# Patient Record
Sex: Female | Born: 1935 | Hispanic: Yes | Marital: Single
Health system: Southern US, Community
[De-identification: ages and names within clinical notes are randomized; demographics above are authoritative.]

## PROBLEM LIST (undated history)

## (undated) DIAGNOSIS — I10 Essential (primary) hypertension: Secondary | ICD-10-CM

---

## 2018-09-28 ENCOUNTER — Observation Stay
Admission: EM | Admit: 2018-09-28 | Discharge: 2018-09-29 | Disposition: A | Discharge status: Home / Self Care | Payer: Medicare Other | Attending: Internal Medicine | Admitting: Internal Medicine

## 2018-09-28 ENCOUNTER — Emergency Department: Payer: Medicare Other

## 2018-09-28 ENCOUNTER — Other Ambulatory Visit: Payer: Self-pay

## 2018-09-28 ENCOUNTER — Encounter: Payer: Self-pay | Admitting: Emergency Medicine

## 2018-09-28 DIAGNOSIS — R0902 Hypoxemia: Secondary | ICD-10-CM

## 2018-09-28 DIAGNOSIS — Z9114 Patient's other noncompliance with medication regimen: Secondary | ICD-10-CM | POA: Diagnosis not present

## 2018-09-28 DIAGNOSIS — I509 Heart failure, unspecified: Secondary | ICD-10-CM | POA: Diagnosis not present

## 2018-09-28 DIAGNOSIS — J9600 Acute respiratory failure, unspecified whether with hypoxia or hypercapnia: Secondary | ICD-10-CM | POA: Diagnosis present

## 2018-09-28 DIAGNOSIS — R0600 Dyspnea, unspecified: Secondary | ICD-10-CM

## 2018-09-28 DIAGNOSIS — Z66 Do not resuscitate: Secondary | ICD-10-CM | POA: Diagnosis not present

## 2018-09-28 DIAGNOSIS — I110 Hypertensive heart disease with heart failure: Secondary | ICD-10-CM | POA: Diagnosis not present

## 2018-09-28 DIAGNOSIS — I700 Atherosclerosis of aorta: Secondary | ICD-10-CM | POA: Diagnosis not present

## 2018-09-28 DIAGNOSIS — J9601 Acute respiratory failure with hypoxia: Principal | ICD-10-CM

## 2018-09-28 DIAGNOSIS — K769 Liver disease, unspecified: Secondary | ICD-10-CM | POA: Insufficient documentation

## 2018-09-28 DIAGNOSIS — Z79899 Other long term (current) drug therapy: Secondary | ICD-10-CM | POA: Diagnosis not present

## 2018-09-28 DIAGNOSIS — I10 Essential (primary) hypertension: Secondary | ICD-10-CM

## 2018-09-28 DIAGNOSIS — F0390 Unspecified dementia without behavioral disturbance: Secondary | ICD-10-CM | POA: Diagnosis not present

## 2018-09-28 HISTORY — DX: Essential (primary) hypertension: I10

## 2018-09-28 LAB — CBC WITH DIFFERENTIAL/PLATELET
ABS IMMATURE GRANULOCYTES: 0.07 10*3/uL (ref 0.00–0.07)
Basophils Absolute: 0.1 10*3/uL (ref 0.0–0.1)
Basophils Relative: 1 %
Eosinophils Absolute: 0.2 10*3/uL (ref 0.0–0.5)
Eosinophils Relative: 2 %
HCT: 38.2 % (ref 36.0–46.0)
HEMOGLOBIN: 12.3 g/dL (ref 12.0–15.0)
Immature Granulocytes: 1 %
LYMPHS PCT: 36 %
Lymphs Abs: 3.6 10*3/uL (ref 0.7–4.0)
MCH: 25.6 pg — AB (ref 26.0–34.0)
MCHC: 32.2 g/dL (ref 30.0–36.0)
MCV: 79.4 fL — ABNORMAL LOW (ref 80.0–100.0)
MONO ABS: 0.8 10*3/uL (ref 0.1–1.0)
Monocytes Relative: 8 %
NEUTROS ABS: 5.4 10*3/uL (ref 1.7–7.7)
Neutrophils Relative %: 52 %
Platelets: 392 10*3/uL (ref 150–400)
RBC: 4.81 MIL/uL (ref 3.87–5.11)
RDW: 13.9 % (ref 11.5–15.5)
WBC: 10.1 10*3/uL (ref 4.0–10.5)
nRBC: 0 % (ref 0.0–0.2)

## 2018-09-28 LAB — BRAIN NATRIURETIC PEPTIDE: B Natriuretic Peptide: 98 pg/mL (ref 0.0–100.0)

## 2018-09-28 LAB — COMPREHENSIVE METABOLIC PANEL
ALK PHOS: 80 U/L (ref 38–126)
ALT: 10 U/L (ref 0–44)
ANION GAP: 10 (ref 5–15)
AST: 16 U/L (ref 15–41)
Albumin: 3.3 g/dL — ABNORMAL LOW (ref 3.5–5.0)
BILIRUBIN TOTAL: 0.6 mg/dL (ref 0.3–1.2)
BUN: 10 mg/dL (ref 8–23)
CALCIUM: 8.9 mg/dL (ref 8.9–10.3)
CO2: 24 mmol/L (ref 22–32)
Chloride: 106 mmol/L (ref 98–111)
Creatinine, Ser: 0.66 mg/dL (ref 0.44–1.00)
GLUCOSE: 99 mg/dL (ref 70–99)
POTASSIUM: 4.2 mmol/L (ref 3.5–5.1)
Sodium: 140 mmol/L (ref 135–145)
Total Protein: 7.6 g/dL (ref 6.5–8.1)

## 2018-09-28 LAB — MRSA PCR SCREENING: MRSA BY PCR: NEGATIVE

## 2018-09-28 LAB — TROPONIN I: Troponin I: <0.03 ng/mL (ref <0.03)

## 2018-09-28 MED ORDER — HYDRALAZINE HCL 20 MG/ML IJ SOLN
10.0000 mg | Freq: Four times a day (QID) | INTRAMUSCULAR | Status: DC | PRN
  Administered 2018-09-28: 10 mg via INTRAVENOUS
  Filled 2018-09-28: qty 1

## 2018-09-28 MED ORDER — FUROSEMIDE 20 MG PO TABS
20.0000 mg | ORAL_TABLET | Freq: Every day | ORAL | Status: DC
  Administered 2018-09-29: 20 mg via ORAL
  Filled 2018-09-28: qty 1

## 2018-09-28 MED ORDER — POTASSIUM CHLORIDE CRYS ER 20 MEQ PO TBCR
20.0000 meq | EXTENDED_RELEASE_TABLET | Freq: Every day | ORAL | Status: DC
  Administered 2018-09-29: 20 meq via ORAL
  Filled 2018-09-28: qty 1

## 2018-09-28 MED ORDER — DOCUSATE SODIUM 100 MG PO CAPS: 100.0000 mg | ORAL_CAPSULE | Freq: Two times a day (BID) | ORAL | Status: DC | PRN

## 2018-09-28 MED ORDER — CLOPIDOGREL BISULFATE 75 MG PO TABS
75.0000 mg | ORAL_TABLET | Freq: Every day | ORAL | Status: DC
  Administered 2018-09-29: 75 mg via ORAL
  Filled 2018-09-28: qty 1

## 2018-09-28 MED ORDER — IOHEXOL 350 MG/ML SOLN
75.0000 mL | Freq: Once | INTRAVENOUS | Status: AC | PRN
  Administered 2018-09-28: 75 mL via INTRAVENOUS

## 2018-09-28 MED ORDER — GUAIFENESIN-CODEINE 100-10 MG/5ML PO SOLN
5.0000 mL | Freq: Four times a day (QID) | ORAL | Status: DC | PRN
  Administered 2018-09-28: 5 mL via ORAL
  Filled 2018-09-28 (×2): qty 5

## 2018-09-28 MED ORDER — GUAIFENESIN 100 MG/5ML PO SOLN
200.0000 mg | Freq: Four times a day (QID) | ORAL | Status: DC | PRN
  Administered 2018-09-29: 200 mg via ORAL
  Filled 2018-09-28 (×3): qty 10

## 2018-09-28 MED ORDER — MAGNESIUM HYDROXIDE 400 MG/5ML PO SUSP
30.0000 mL | Freq: Every evening | ORAL | Status: DC | PRN
  Filled 2018-09-28: qty 30

## 2018-09-28 MED ORDER — DONEPEZIL HCL 5 MG PO TABS
10.0000 mg | ORAL_TABLET | Freq: Every day | ORAL | Status: DC
  Administered 2018-09-29: 10 mg via ORAL
  Filled 2018-09-28: qty 2

## 2018-09-28 MED ORDER — ENOXAPARIN SODIUM 40 MG/0.4ML ~~LOC~~ SOLN
40.0000 mg | SUBCUTANEOUS | Status: DC
  Filled 2018-09-28: qty 0.4

## 2018-09-28 MED ORDER — ATORVASTATIN CALCIUM 10 MG PO TABS
10.0000 mg | ORAL_TABLET | Freq: Every day | ORAL | Status: DC
  Administered 2018-09-28 – 2018-09-29 (×2): 10 mg via ORAL
  Filled 2018-09-28 (×2): qty 1

## 2018-09-28 MED ORDER — VITAMIN D 1000 UNITS PO TABS
1000.0000 [IU] | ORAL_TABLET | Freq: Every day | ORAL | Status: DC
  Administered 2018-09-29: 1000 [IU] via ORAL
  Filled 2018-09-28: qty 1

## 2018-09-28 MED ORDER — HYDRALAZINE HCL 20 MG/ML IJ SOLN
INTRAMUSCULAR | Status: AC
  Filled 2018-09-28: qty 1

## 2018-09-28 MED ORDER — IPRATROPIUM-ALBUTEROL 0.5-2.5 (3) MG/3ML IN SOLN
3.0000 mL | Freq: Once | RESPIRATORY_TRACT | Status: AC
  Administered 2018-09-28: 3 mL via RESPIRATORY_TRACT
  Filled 2018-09-28: qty 3

## 2018-09-28 MED ORDER — DEXTROSE 5 % IV SOLN
250.0000 mg | INTRAVENOUS | Status: DC
  Administered 2018-09-28: 250 mg via INTRAVENOUS
  Filled 2018-09-28 (×2): qty 250

## 2018-09-28 MED ORDER — AMLODIPINE BESYLATE 10 MG PO TABS
10.0000 mg | ORAL_TABLET | Freq: Every day | ORAL | Status: DC
  Administered 2018-09-29: 10 mg via ORAL
  Filled 2018-09-28: qty 1

## 2018-09-28 MED ORDER — DOCUSATE SODIUM 100 MG PO CAPS
100.0000 mg | ORAL_CAPSULE | Freq: Two times a day (BID) | ORAL | Status: DC
  Administered 2018-09-28 – 2018-09-29 (×2): 100 mg via ORAL
  Filled 2018-09-28 (×2): qty 1

## 2018-09-28 MED ORDER — IPRATROPIUM-ALBUTEROL 0.5-2.5 (3) MG/3ML IN SOLN
RESPIRATORY_TRACT | Status: AC
  Administered 2018-09-28: 3 mL
  Filled 2018-09-28: qty 3

## 2018-09-28 MED ORDER — IPRATROPIUM-ALBUTEROL 0.5-2.5 (3) MG/3ML IN SOLN
3.0000 mL | RESPIRATORY_TRACT | Status: DC
  Administered 2018-09-28 – 2018-09-29 (×3): 3 mL via RESPIRATORY_TRACT
  Filled 2018-09-28 (×4): qty 3

## 2018-09-28 MED ORDER — LISINOPRIL 10 MG PO TABS
10.0000 mg | ORAL_TABLET | Freq: Every day | ORAL | Status: DC
  Administered 2018-09-29: 10 mg via ORAL
  Filled 2018-09-28: qty 1

## 2018-09-28 MED ORDER — METHYLPREDNISOLONE SODIUM SUCC 125 MG IJ SOLR
125.0000 mg | Freq: Once | INTRAMUSCULAR | Status: AC
  Administered 2018-09-28: 125 mg via INTRAVENOUS
  Filled 2018-09-28: qty 2

## 2018-09-28 MED ORDER — ACETAMINOPHEN 500 MG PO TABS: 500.0000 mg | ORAL_TABLET | ORAL | Status: DC | PRN

## 2018-09-28 NOTE — ED Notes (Addendum)
Pt stated that she did not want to stay and that she wanted to go home. She also refused to let this nurse take vital signs. Pt asked me to call her sister. In looking at her paperwork from [facility], [employer] listed as primary contact. Pt stated this is her sister. This nurse called Addison Naegeli and was told that this is her daughter, who has POA. She spoke to pt, who agreed to stay. Daughter would like to be contacted when a room is assigned to pt. Daughter unable to come from [place], and states pt sometimes has sundowners.   Daughter Katina Degree [phone]

## 2018-09-28 NOTE — ED Notes (Signed)
Pt given hydralazine; she was afraid that she was getting a "shot." She cried when the medication went in. This nurse explained about the IV catheter and let her touch one. She seemed to understand and will hopefull be cooperative for future medications.

## 2018-09-28 NOTE — ED Notes (Signed)
Pt given sandwich tray and water 

## 2018-09-28 NOTE — ED Provider Notes (Signed)
[facility] Emergency Department Provider Note   ____________________________________________   First MD Initiated Contact with Patient 09/28/18 1109     (approximate)  I have reviewed the triage vital signs and the nursing notes.   HISTORY  Chief Complaint Wheezing and Cough    HPI Jessica House is a 82 y.o. female patient with wheezing and cough for about 4 days.  Increasing short of breath.  She has history of dementia and also reportedly has been refusing care at the nursing home today.  Past Medical History:  Diagnosis Date  . Hypertension     There are no active problems to display for this patient.   History reviewed. No pertinent surgical history.  Prior to Admission medications   Not on File    Allergies Patient has no known allergies.  History reviewed. No pertinent family history.  Social History Social History   Tobacco Use  . Smoking status: Unknown If Ever Smoked  Substance Use Topics  . Alcohol use: Not Currently  . Drug use: Not Currently    Review of Systems  Patient too short of breath to do this.  ____________________________________________   PHYSICAL EXAM:  VITAL SIGNS: ED Triage Vitals [09/28/18 1123]  Enc Vitals Group     BP (!) 163/85     Pulse Rate 92     Resp 16     Temp 98.5 F (36.9 C)     Temp Source Axillary     SpO2 100 %     Weight 125 lb (56.7 kg)     Height 5\' 3"  (1.6 m)     Head Circumference      Peak Flow      Pain Score      Pain Loc      Pain Edu?      Excl. in GC?     Constitutional: Alert .  Short of breath follows most commands but not all Eyes: Conjunctivae are normal. I. Head: Atraumatic. Nose: No congestion/rhinnorhea. Mouth/Throat: Mucous membranes are moist.  Oropharynx non-erythematous. Neck: No stridor.  Cardiovascular: Normal rate, regular rhythm. Grossly normal heart sounds.  Good peripheral circulation. Respiratory: Increased respiratory effort.  No  retractions. Lungs wheezing diffusely Gastrointestinal: Soft and nontender. No distention. No abdominal bruits. No CVA tenderness. Musculoskeletal: No lower extremity tenderness slight trace edema she has a lot of wrinkles in the legs consistent with having lost a lot of swelling recently.   Neurologic: . No gross focal neurologic deficits are appreciated.  Skin:  Skin is warm, dry and intact. No rash noted.   ____________________________________________   LABS (all labs ordered are listed, but only abnormal results are displayed)  Labs Reviewed  COMPREHENSIVE METABOLIC PANEL - Abnormal; Notable for the following components:      Result Value   Albumin 3.3 (*)    All other components within normal limits  CBC WITH DIFFERENTIAL/PLATELET - Abnormal; Notable for the following components:   MCV 79.4 (*)    MCH 25.6 (*)    All other components within normal limits  TROPONIN I  BRAIN NATRIURETIC PEPTIDE  URINALYSIS, COMPLETE (UACMP) WITH MICROSCOPIC   ____________________________________________  EKG   ____________________________________________  RADIOLOGY  ED MD interpretation:    Official radiology report(s): Ct Angio Chest Pe W And/or Wo Contrast  Result Date: 09/28/2018 CLINICAL DATA:  cough and wheezing x 4 days. Pt was started on cough medicine at facility. Pt taking albuterol and duoneb upon arrival at ED. Pt has hx dementia,  htn. EMS also indicates area of cellulitis on LLE. Pt alert, follows commands. EXAM: CT ANGIOGRAPHY CHEST WITH CONTRAST TECHNIQUE: Multidetector CT imaging of the chest was performed using the standard protocol during bolus administration of intravenous contrast. Multiplanar CT image reconstructions and MIPs were obtained to evaluate the vascular anatomy. CONTRAST:  75mL OMNIPAQUE IOHEXOL 350 MG/ML SOLN COMPARISON:  None. FINDINGS: Cardiovascular: Mild four-chamber cardiac enlargement. No pericardial effusion. Satisfactory opacification of pulmonary  arteries noted, and there is no evidence of pulmonary emboli. Adequate contrast opacification of the thoracic aorta with no evidence of dissection, aneurysm, or stenosis. There is classic 3-vessel brachiocephalic arch anatomy without proximal stenosis. Scattered calcified plaque in the arch. Mediastinum/Nodes: No hilar or mediastinal adenopathy. Lungs/Pleura: No pleural effusion. No pneumothorax. Subsegmental atelectasis laterally at the right lung base. Upper Abdomen: 2.4 cm low-attenuation lateral left hepatic segment lesion, incompletely characterized. Musculoskeletal: Thoracic kyphosis. Mild T7 superior endplate compression deformity, age indeterminate. Anterior vertebral endplate spurring at multiple levels in the mid and lower thoracic spine. Review of the MIP images confirms the above findings. IMPRESSION: 1. Negative for acute PE or thoracic aortic dissection. 2. 2.4 cm lateral left liver lesion, possibly benign cyst but incompletely characterized. Ultrasound may be useful for definitive characterization. Electronically Signed   By: Corlis Leak M.D.   On: 09/28/2018 16:25   Dg Chest Portable 1 View  Result Date: 09/28/2018 CLINICAL DATA:  Cough and wheezing for the past 4 days. Also shortness of breath. EXAM: PORTABLE CHEST 1 VIEW COMPARISON:  None. FINDINGS: The lungs are borderline hypoinflated. The interstitial markings are coarse. The cardiac silhouette is enlarged. The pulmonary vascularity is prominent. There is no pleural effusion. The bony thorax exhibits diffuse subjective osteopenia but no acute abnormality. There is calcification in the wall of the aortic arch. IMPRESSION: The findings suggest low-grade CHF with mild interstitial edema. One cannot exclude interstitial pneumonia in the appropriate clinical setting. Thoracic aortic atherosclerosis. Electronically Signed   By: David  [place] M.D.   On: 09/28/2018 12:10     ____________________________________________   PROCEDURES  Procedure(s) performed:  Procedures  Critical Care performed:   ____________________________________________   INITIAL IMPRESSION / ASSESSMENT AND PLAN / ED COURSE   Patient's O2 saturation falls to 87% with ambulation.  She is still wheezing and short of breath.  Her CT of the chest shows no PE.  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  She has had 3 DuoNeb Solu-Medrol and one albuterol neb with only slight improvement.  We will get her in the hospital to finish her treatment.        ____________________________________________   FINAL CLINICAL IMPRESSION(S) / ED DIAGNOSES  Final diagnoses:  Dyspnea, unspecified type  Hypoxia     ED Discharge Orders    None       Note:  This document was prepared using Dragon voice recognition software and may include unintentional dictation errors.    Arnaldo Natal, MD 09/28/18 [phone]

## 2018-09-28 NOTE — ED Notes (Signed)
Pt's O2 sat dropped to 87% when walking with walker; recovered to 91%.

## 2018-09-28 NOTE — H&P (Signed)
[facility] at [facility]   PATIENT NAME: Jessica House    MR#:  [id number]  DATE OF BIRTH:  1936-05-10  DATE OF ADMISSION:  09/28/2018  PRIMARY CARE PHYSICIAN: [facility]   REQUESTING/REFERRING PHYSICIAN: Malinda  CHIEF COMPLAINT:   Chief Complaint  Patient presents with  . Wheezing  . Cough    HISTORY OF PRESENT ILLNESS: Jessica House  is a 82 y.o. female with a known history of hypertension-resident of a nursing home, brought to emergency room with cough and wheezing for 4 days.  She was noted to be hypoxic on ambulation by ER physician.  As per documentation by EMS, patient also was refusing further medications due to her dementia.  Her blood pressure noted to be high and she was noted to have hypoxia on ambulation in ER.  CT scan of the chest did not report any abnormalities.  But she had wheezing and required nebulizer therapy so ER physician suggested to keep on the hospitalist service for tonight.  PAST MEDICAL HISTORY:   Past Medical History:  Diagnosis Date  . Hypertension     PAST SURGICAL HISTORY: History reviewed. No pertinent surgical history.  SOCIAL HISTORY:  Social History   Tobacco Use  . Smoking status: Unknown If Ever Smoked  Substance Use Topics  . Alcohol use: Not Currently    FAMILY HISTORY:  Family History  Family history unknown: Yes    DRUG ALLERGIES:  Allergies  Allergen Reactions  . Orange Fruit [Citrus]     Orange juice    . Pineapple   . Pork Allergy     REVIEW OF SYSTEMS:   CONSTITUTIONAL: No fever, fatigue or weakness.  EYES: No blurred or double vision.  EARS, NOSE, AND THROAT: No tinnitus or ear pain.  RESPIRATORY: Have cough, shortness of breath, wheezing, no hemoptysis.  CARDIOVASCULAR: No chest pain, orthopnea, edema.  GASTROINTESTINAL: No nausea, vomiting, diarrhea or abdominal pain.  GENITOURINARY: No dysuria, hematuria.  ENDOCRINE: No polyuria, nocturia,  HEMATOLOGY: No  anemia, easy bruising or bleeding SKIN: No rash or lesion. MUSCULOSKELETAL: No joint pain or arthritis.   NEUROLOGIC: No tingling, numbness, weakness.  PSYCHIATRY: No anxiety or depression.   MEDICATIONS AT HOME:  Prior to Admission medications   Medication Sig Start Date End Date Taking? Authorizing Provider  acetaminophen (TYLENOL) 500 MG tablet Take 500 mg by mouth every 4 (four) hours as needed.   Yes [provider]  alum & mag hydroxide-simeth (MINTOX REGULAR STRENGTH) 200-200-20 MG/5ML suspension Take 30 mLs by mouth every 6 (six) hours as needed for indigestion or heartburn.   Yes [provider]  amLODipine (NORVASC) 10 MG tablet Take 10 mg by mouth daily.   Yes [provider]  atorvastatin (LIPITOR) 10 MG tablet Take 10 mg by mouth daily.   Yes [provider]  cholecalciferol (VITAMIN D) 1000 units tablet Take 1,000 Units by mouth daily.   Yes [provider]  clopidogrel (PLAVIX) 75 MG tablet Take 75 mg by mouth daily.   Yes [provider]  docusate sodium (COLACE) 100 MG capsule Take 100 mg by mouth 2 (two) times daily.   Yes [provider]  donepezil (ARICEPT) 10 MG tablet Take 10 mg by mouth daily.   Yes [provider]  ENSURE PLUS (ENSURE PLUS) LIQD Take 237 mLs by mouth 2 (two) times daily between meals.   Yes [provider]  furosemide (LASIX) 20 MG tablet Take 20 mg by mouth  daily.   Yes [provider]  guaifenesin (ROBITUSSIN) 100 MG/5ML syrup Take 200 mg by mouth 4 (four) times daily as needed for cough.   Yes [provider]  lisinopril (PRINIVIL,ZESTRIL) 10 MG tablet Take 10 mg by mouth daily.   Yes [provider]  magnesium hydroxide (MILK OF MAGNESIA) 400 MG/5ML suspension Take 30 mLs by mouth at bedtime as needed for mild constipation.   Yes [provider]  neomycin-bacitracin-polymyxin (NEOSPORIN) [phone] ointment Apply 1 application  topically 4 (four) times daily as needed. For minor skin tears or abrasions, clean area with normal saline, apply ointment and cover with bandaid or gauze. Change as needed until healed.   Yes [provider]  potassium chloride SA (K-DUR,KLOR-CON) 20 MEQ tablet Take 20 mEq by mouth daily.   Yes [provider]      PHYSICAL EXAMINATION:   VITAL SIGNS: Blood pressure (!) 193/97, pulse 94, temperature 98.5 F (36.9 C), temperature source Axillary, resp. rate 18, height 5\' 3"  (1.6 m), weight 56.7 kg, SpO2 100 %.  GENERAL:  82 y.o.-year-old patient lying in the bed with no acute distress.  EYES: Pupils equal, round, reactive to light and accommodation. No scleral icterus. Extraocular muscles intact.  HEENT: Head atraumatic, normocephalic. Oropharynx and nasopharynx clear.  NECK:  Supple, no jugular venous distention. No thyroid enlargement, no tenderness.  LUNGS: Normal breath sounds bilaterally, minimal wheezing, no crepitation. No use of accessory muscles of respiration.  CARDIOVASCULAR: S1, S2 normal. No murmurs, rubs, or gallops.  ABDOMEN: Soft, nontender, nondistended. Bowel sounds present. No organomegaly or mass.  EXTREMITIES: No pedal edema, cyanosis, or clubbing.  NEUROLOGIC: Cranial nerves II through XII are intact. Muscle strength 4/5 in all extremities. Sensation intact. Gait not checked.  PSYCHIATRIC: The patient is alert and oriented x 1.  SKIN: No obvious rash, lesion, or ulcer.   LABORATORY PANEL:   CBC Recent Labs  Lab 09/28/18 1124  WBC 10.1  HGB 12.3  HCT 38.2  PLT 392  MCV 79.4*  MCH 25.6*  MCHC 32.2  RDW 13.9  LYMPHSABS 3.6  MONOABS 0.8  EOSABS 0.2  BASOSABS 0.1   ------------------------------------------------------------------------------------------------------------------  Chemistries  Recent Labs  Lab 09/28/18 1124  NA 140  K 4.2  CL 106  CO2 24  GLUCOSE 99  BUN 10  CREATININE 0.66  CALCIUM 8.9  AST 16  ALT 10  ALKPHOS  80  BILITOT 0.6   ------------------------------------------------------------------------------------------------------------------ estimated creatinine clearance is 46.4 mL/min (by C-G formula based on SCr of 0.66 mg/dL). ------------------------------------------------------------------------------------------------------------------ No results for input(s): TSH, T4TOTAL, T3FREE, THYROIDAB in the last 72 hours.  Invalid input(s): FREET3   Coagulation profile No results for input(s): INR, PROTIME in the last 168 hours. ------------------------------------------------------------------------------------------------------------------- No results for input(s): DDIMER in the last 72 hours. -------------------------------------------------------------------------------------------------------------------  Cardiac Enzymes Recent Labs  Lab 09/28/18 1124  TROPONINI <0.03   ------------------------------------------------------------------------------------------------------------------ Invalid input(s): POCBNP  ---------------------------------------------------------------------------------------------------------------  Urinalysis No results found for: COLORURINE, APPEARANCEUR, LABSPEC, PHURINE, GLUCOSEU, HGBUR, BILIRUBINUR, KETONESUR, PROTEINUR, UROBILINOGEN, NITRITE, LEUKOCYTESUR   RADIOLOGY: Ct Angio Chest Pe W And/or Wo Contrast  Result Date: 09/28/2018 CLINICAL DATA:  cough and wheezing x 4 days. Pt was started on cough medicine at facility. Pt taking albuterol and duoneb upon arrival at ED. Pt has hx dementia, htn. EMS also indicates area of cellulitis on LLE. Pt alert, follows commands. EXAM: CT ANGIOGRAPHY CHEST WITH CONTRAST TECHNIQUE: Multidetector CT imaging of the chest was performed using the standard protocol during bolus administration of intravenous  contrast. Multiplanar CT image reconstructions and MIPs were obtained to evaluate the vascular anatomy. CONTRAST:  75mL  OMNIPAQUE IOHEXOL 350 MG/ML SOLN COMPARISON:  None. FINDINGS: Cardiovascular: Mild four-chamber cardiac enlargement. No pericardial effusion. Satisfactory opacification of pulmonary arteries noted, and there is no evidence of pulmonary emboli. Adequate contrast opacification of the thoracic aorta with no evidence of dissection, aneurysm, or stenosis. There is classic 3-vessel brachiocephalic arch anatomy without proximal stenosis. Scattered calcified plaque in the arch. Mediastinum/Nodes: No hilar or mediastinal adenopathy. Lungs/Pleura: No pleural effusion. No pneumothorax. Subsegmental atelectasis laterally at the right lung base. Upper Abdomen: 2.4 cm low-attenuation lateral left hepatic segment lesion, incompletely characterized. Musculoskeletal: Thoracic kyphosis. Mild T7 superior endplate compression deformity, age indeterminate. Anterior vertebral endplate spurring at multiple levels in the mid and lower thoracic spine. Review of the MIP images confirms the above findings. IMPRESSION: 1. Negative for acute PE or thoracic aortic dissection. 2. 2.4 cm lateral left liver lesion, possibly benign cyst but incompletely characterized. Ultrasound may be useful for definitive characterization. Electronically Signed   By: Corlis Leak M.D.   On: 09/28/2018 16:25   Dg Chest Portable 1 View  Result Date: 09/28/2018 CLINICAL DATA:  Cough and wheezing for the past 4 days. Also shortness of breath. EXAM: PORTABLE CHEST 1 VIEW COMPARISON:  None. FINDINGS: The lungs are borderline hypoinflated. The interstitial markings are coarse. The cardiac silhouette is enlarged. The pulmonary vascularity is prominent. There is no pleural effusion. The bony thorax exhibits diffuse subjective osteopenia but no acute abnormality. There is calcification in the wall of the aortic arch. IMPRESSION: The findings suggest low-grade CHF with mild interstitial edema. One cannot exclude interstitial pneumonia in the appropriate clinical setting.  Thoracic aortic atherosclerosis. Electronically Signed   By: David  [place]il[place] M.D.   On: 09/28/2018 12:10    EKG: Orders placed or performed during the hospital encounter of 09/28/18  . ED EKG  . ED EKG    IMPRESSION AND PLAN:  *Acute respiratory failure with hypoxia This was noted by ER physician on ambulation. Currently during my exam patient was more than 90% on room air. Likely there is a component of bronchitis. I will give azithromycin and cough syrup with some nebulizer therapy.  *Accelerated hypertension Blood pressure is uncontrolled.  As per ER physician's note patient was noncompliant with the medication at nursing home today. I will continue home medications and give hydralazine IV.  *Dementia Continue baseline home medication.  All the records are reviewed and case discussed with ED provider. Management plans discussed with the patient, family and they are in agreement.  CODE STATUS: DNR  Her CODE STATUS noted to be DNR and nursing home documents but there is no portable DNR papers sent with her.  We may need to confirm on this.  TOTAL TIME TAKING CARE OF THIS PATIENT: 45 minutes.    Altamese Dilling M.D on 09/28/2018   Between 7am to 6pm - Pager - [phone]  After 6pm go to [email or web address] - Social research officer, government  [facility]  Office  [phone]  CC: Primary care physician; [facility]   Note: This dictation was prepared with Dragon dictation along with smaller phrase technology. Any transcriptional errors that result from this process are unintentional.

## 2018-09-28 NOTE — ED Notes (Signed)
Pt soaked through undergarment and chux. Pt changed, cleaned, given new warm blanket and diaper, bedding.

## 2018-09-28 NOTE — Progress Notes (Signed)
Spoke to pt daughter and POA Addison Naegeli. She would like for medical staff to contact her during the day with updates and estimated dated of discharge.

## 2018-09-28 NOTE — ED Notes (Signed)
Blood cultures sent with blood tubes

## 2018-09-28 NOTE — ED Triage Notes (Signed)
Pt via ems from [facility] with cough and wheezing x 4 days. Pt was started on cough medicine at facility. Pt taking albuterol and duoneb upon arrival at ED. Pt has hx dementia, htn. EMS also indicates area of cellulitis on LLE. Pt alert, follows commands.

## 2018-09-29 DIAGNOSIS — J9601 Acute respiratory failure with hypoxia: Secondary | ICD-10-CM | POA: Diagnosis not present

## 2018-09-29 LAB — CBC
HEMATOCRIT: 33.7 % — AB (ref 36.0–46.0)
HEMOGLOBIN: 10.7 g/dL — AB (ref 12.0–15.0)
MCH: 25.4 pg — ABNORMAL LOW (ref 26.0–34.0)
MCHC: 31.8 g/dL (ref 30.0–36.0)
MCV: 79.9 fL — ABNORMAL LOW (ref 80.0–100.0)
NRBC: 0 % (ref 0.0–0.2)
Platelets: 329 10*3/uL (ref 150–400)
RBC: 4.22 MIL/uL (ref 3.87–5.11)
RDW: 13.9 % (ref 11.5–15.5)
WBC: 17.3 10*3/uL — AB (ref 4.0–10.5)

## 2018-09-29 LAB — BASIC METABOLIC PANEL
Anion gap: 8 (ref 5–15)
BUN: 16 mg/dL (ref 8–23)
CO2: 23 mmol/L (ref 22–32)
CREATININE: 0.72 mg/dL (ref 0.44–1.00)
Calcium: 8.7 mg/dL — ABNORMAL LOW (ref 8.9–10.3)
Chloride: 108 mmol/L (ref 98–111)
GFR calc non Af Amer: >60 mL/min (ref >60)
Glucose, Bld: 116 mg/dL — ABNORMAL HIGH (ref 70–99)
POTASSIUM: 3.8 mmol/L (ref 3.5–5.1)
SODIUM: 139 mmol/L (ref 135–145)

## 2018-09-29 MED ORDER — GUAIFENESIN-CODEINE 100-10 MG/5ML PO SOLN: 5.0000 mL | Freq: Four times a day (QID) | ORAL | 0 refills | Status: AC | PRN

## 2018-09-29 MED ORDER — AZITHROMYCIN 250 MG PO TABS
250.0000 mg | ORAL_TABLET | Freq: Every day | ORAL | Status: DC
  Administered 2018-09-29: 250 mg via ORAL
  Filled 2018-09-29: qty 1

## 2018-09-29 MED ORDER — IPRATROPIUM-ALBUTEROL 0.5-2.5 (3) MG/3ML IN SOLN
3.0000 mL | Freq: Four times a day (QID) | RESPIRATORY_TRACT | Status: DC
  Filled 2018-09-29: qty 3

## 2018-09-29 MED ORDER — AZITHROMYCIN 250 MG PO TABS: ORAL_TABLET | ORAL | 0 refills | Status: AC

## 2018-09-29 NOTE — NC FL2 (Addendum)
[place] MEDICAID FL2 LEVEL OF CARE SCREENING TOOL     IDENTIFICATION  Patient Name: Jessica House Birthdate: September 12, 1936 Sex: female Admission Date (Current Location): 09/28/2018  [facility] and [place] Number:  Chiropodist and Address:  [facility], [address] [place], [place] [address]      Provider Number: [id number]  Attending Physician Name and Address:  Enedina Finner, MD  Relative Name and Phone Number:       Current Level of Care: Hospital Recommended Level of Care: Assisted Living Facility, Memory Care Prior Approval Number:    Date Approved/Denied:   PASRR Number:    Discharge Plan: (ALF memory)    Current Diagnoses: Patient Active Problem List   Diagnosis Date Noted  . Acute respiratory failure with hypoxia (HCC) 09/28/2018  . Accelerated hypertension 09/28/2018  . Acute respiratory failure (HCC) 09/28/2018    Orientation RESPIRATION BLADDER Height & Weight     Self, Place  Normal Incontinent Weight: 167 lb 12.3 oz (76.1 kg) Height:  5\' 3"  (160 cm)  BEHAVIORAL SYMPTOMS/MOOD NEUROLOGICAL BOWEL NUTRITION STATUS  (none) (no issues reported) Incontinent Diet(heart healthy)  AMBULATORY STATUS COMMUNICATION OF NEEDS Skin   Supervision Verbally Normal                       Personal Care Assistance Level of Assistance  Bathing, Feeding, Dressing Bathing Assistance: Limited assistance Feeding assistance: Limited assistance Dressing Assistance: Limited assistance     Functional Limitations Info  (no issues reported)          SPECIAL CARE FACTORS FREQUENCY                       Contractures Contractures Info: Not present    Additional Factors Info  Code Status Code Status Info: dnr             DISCHARGE MEDICATIONS:        Allergies as of 09/29/2018      Reactions   Orange Fruit [citrus]    Orange juice    Pineapple    Pork Allergy          Medication List    TAKE these  medications   acetaminophen 500 MG tablet Commonly known as:  TYLENOL Take 500 mg by mouth every 4 (four) hours as needed.   amLODipine 10 MG tablet Commonly known as:  NORVASC Take 10 mg by mouth daily.   atorvastatin 10 MG tablet Commonly known as:  LIPITOR Take 10 mg by mouth daily.   azithromycin 250 MG tablet Commonly known as:  ZITHROMAX Take daily as dirrected   cholecalciferol 1000 units tablet Commonly known as:  VITAMIN D Take 1,000 Units by mouth daily.   clopidogrel 75 MG tablet Commonly known as:  PLAVIX Take 75 mg by mouth daily.   docusate sodium 100 MG capsule Commonly known as:  COLACE Take 100 mg by mouth 2 (two) times daily.   donepezil 10 MG tablet Commonly known as:  ARICEPT Take 10 mg by mouth daily.   ENSURE PLUS Liqd Take 237 mLs by mouth 2 (two) times daily between meals.   furosemide 20 MG tablet Commonly known as:  LASIX Take 20 mg by mouth daily.   guaifenesin 100 MG/5ML syrup Commonly known as:  ROBITUSSIN Take 200 mg by mouth 4 (four) times daily as needed for cough.   guaiFENesin-codeine 100-10 MG/5ML syrup Take 5 mLs by mouth every 6 (six) hours  as needed for cough.   lisinopril 10 MG tablet Commonly known as:  PRINIVIL,ZESTRIL Take 10 mg by mouth daily.   magnesium hydroxide 400 MG/5ML suspension Commonly known as:  MILK OF MAGNESIA Take 30 mLs by mouth at bedtime as needed for mild constipation.   MINTOX REGULAR STRENGTH 200-200-20 MG/5ML suspension Generic drug:  alum & mag hydroxide-simeth Take 30 mLs by mouth every 6 (six) hours as needed for indigestion or heartburn.   neomycin-bacitracin-polymyxin [phone] ointment Apply 1 application topically 4 (four) times daily as needed. For minor skin tears or abrasions, clean area with normal saline, apply ointment and cover with bandaid or gauze. Change as needed until healed.   potassium chloride SA 20 MEQ tablet Commonly known as:  K-DUR,KLOR-CON Take 20  mEq by mouth daily.       [place] Spaniel, [place]

## 2018-09-29 NOTE — Clinical Social Work Note (Signed)
Clinical Social Work Assessment  Patient Details  Name: Jessica House MRN: [id number] Date of Birth: 23-May-1936  Date of referral:  09/29/18               Reason for consult:  Facility Placement                Permission sought to share information with:    Permission granted to share information::     Name::        Agency::     Relationship::     Contact Information:     Housing/Transportation Living arrangements for the past 2 months:  Assisted Living Facility Source of Information:  Adult Children Patient Interpreter Needed:  None Criminal Activity/Legal Involvement Pertinent to Current Situation/Hospitalization:  No - Comment as needed Significant Relationships:  Adult Children Lives with:  Facility Resident Do you feel safe going back to the place where you live?  Yes Need for family participation in patient care:  Yes (Comment)  Care giving concerns:  Patient resides at [facility].   Social Worker assessment / plan:  Patient discharging today to return to [facility]. CSW spoke with Olevia Perches at [facility] and he has stated patient can return. CSW contacted patient's daughter: Porfirio Oar and she stated she could not transport. As [employer] does not transport, she gave permission for EMS to transport.  Employment status:    Insurance information:    PT Recommendations:    Information / Referral to community resources:     Patient/Family's Response to care:  Patient's daughter expressed appreciation for call.  Patient/Family's Understanding of and Emotional Response to Diagnosis, Current Treatment, and Prognosis:  Patient's daughter did not ask any questions and kept her words brief.  Emotional Assessment Appearance:  Appears stated age Attitude/Demeanor/Rapport:    Affect (typically observed):  Calm Orientation:  Oriented to Self Alcohol / Substance use:  Not Applicable Psych involvement (Current and /or in the  community):  No (Comment)  Discharge Needs  Concerns to be addressed:  Care Coordination Readmission within the last 30 days:  No Current discharge risk:  None Barriers to Discharge:  No Barriers Identified   York Spaniel, LCSW 09/29/2018, 1:57 PM

## 2018-09-29 NOTE — Care Management Obs Status (Signed)
MEDICARE OBSERVATION STATUS NOTIFICATION   Patient Details  Name: Jessica House MRN: [id number] Date of Birth: 11-Apr-1936   Medicare Observation Status Notification Given:  No(admitted obs less than 24 hours)    Chapman Fitch, RN 09/29/2018, 11:39 AM

## 2018-09-29 NOTE — Progress Notes (Signed)
   09/29/18 1300  Clinical Encounter Type  Visited With Patient  Visit Type Initial;Spiritual support  Recommendations Follow-up as requested.  Spiritual Encounters  Spiritual Needs Emotional   Brief encounter of introduction. Patient was eating and concentrated on watching TV. Chaplain reminded the patient that pastoral care is available whenever needed.

## 2018-09-29 NOTE — Discharge Summary (Signed)
[facility] [place] at [facility]   PATIENT NAME: Jessica House    MR#:  [id number]  DATE OF BIRTH:  1936/01/04  DATE OF ADMISSION:  09/28/2018 ADMITTING PHYSICIAN: Altamese Dilling, MD  DATE OF DISCHARGE: 09/29/2018  PRIMARY CARE PHYSICIAN: [facility]    ADMISSION DIAGNOSIS:  Hypoxia [R09.02] Dyspnea, unspecified type [R06.00]  DISCHARGE DIAGNOSIS:  Acute hypoxia --resolved Acute Bronchitis  SECONDARY DIAGNOSIS:   Past Medical History:  Diagnosis Date  . Hypertension     HOSPITAL COURSE:  Jessica House  is a 82 y.o. female with a known history of hypertension-resident of a nursing home, brought to emergency room with cough and wheezing for 4 days.  She was noted to be hypoxic on ambulation by ER physician.  *Acute respiratory failure with hypoxia--resolved. No respiratory distress noted -Acute Bronchtis--Cont azithromycin and cough syrup with some nebulizer therapy. Currently during my exam patient was more than 94% on room air.  *Accelerated hypertension Blood pressure is better  I will continue home medications and give hydralazine IV.  *Dementia Continue baseline home medication.  *h/o CHF chronic Pt not in Heart failure BNP neg and does not appear volume overloaded clinically  RN to ambulate pt. D/w dter  Porfirio Oar D/c back to [facility]  CODE STATUS: DNR    CONSULTS OBTAINED:    DRUG ALLERGIES:   Allergies  Allergen Reactions  . Orange Fruit [Citrus]     Orange juice    . Pineapple   . Pork Allergy     DISCHARGE MEDICATIONS:   Allergies as of 09/29/2018      Reactions   Orange Fruit [citrus]    Orange juice    Pineapple    Pork Allergy       Medication List    TAKE these medications   acetaminophen 500 MG tablet Commonly known as:  TYLENOL Take 500 mg by mouth every 4 (four) hours as needed.   amLODipine 10 MG tablet Commonly known as:  NORVASC Take 10 mg by mouth  daily.   atorvastatin 10 MG tablet Commonly known as:  LIPITOR Take 10 mg by mouth daily.   azithromycin 250 MG tablet Commonly known as:  ZITHROMAX Take daily as dirrected   cholecalciferol 1000 units tablet Commonly known as:  VITAMIN D Take 1,000 Units by mouth daily.   clopidogrel 75 MG tablet Commonly known as:  PLAVIX Take 75 mg by mouth daily.   docusate sodium 100 MG capsule Commonly known as:  COLACE Take 100 mg by mouth 2 (two) times daily.   donepezil 10 MG tablet Commonly known as:  ARICEPT Take 10 mg by mouth daily.   ENSURE PLUS Liqd Take 237 mLs by mouth 2 (two) times daily between meals.   furosemide 20 MG tablet Commonly known as:  LASIX Take 20 mg by mouth daily.   guaifenesin 100 MG/5ML syrup Commonly known as:  ROBITUSSIN Take 200 mg by mouth 4 (four) times daily as needed for cough.   guaiFENesin-codeine 100-10 MG/5ML syrup Take 5 mLs by mouth every 6 (six) hours as needed for cough.   lisinopril 10 MG tablet Commonly known as:  PRINIVIL,ZESTRIL Take 10 mg by mouth daily.   magnesium hydroxide 400 MG/5ML suspension Commonly known as:  MILK OF MAGNESIA Take 30 mLs by mouth at bedtime as needed for mild constipation.   MINTOX REGULAR STRENGTH 200-200-20 MG/5ML suspension Generic drug:  alum & mag hydroxide-simeth Take 30 mLs by mouth every 6 (six)  hours as needed for indigestion or heartburn.   neomycin-bacitracin-polymyxin [phone] ointment Apply 1 application topically 4 (four) times daily as needed. For minor skin tears or abrasions, clean area with normal saline, apply ointment and cover with bandaid or gauze. Change as needed until healed.   potassium chloride SA 20 MEQ tablet Commonly known as:  K-DUR,KLOR-CON Take 20 mEq by mouth daily.       If you experience worsening of your admission symptoms, develop shortness of breath, life threatening emergency, suicidal or homicidal thoughts you must seek medical attention immediately  by calling 911 or calling your MD immediately  if symptoms less severe.  You Must read complete instructions/literature along with all the possible adverse reactions/side effects for all the Medicines you take and that have been prescribed to you. Take any new Medicines after you have completely understood and accept all the possible adverse reactions/side effects.   Please note  You were cared for by a hospitalist during your hospital stay. If you have any questions about your discharge medications or the care you received while you were in the hospital after you are discharged, you can call the unit and asked to speak with the hospitalist on call if the hospitalist that took care of you is not available. Once you are discharged, your primary care physician will handle any further medical issues. Please note that NO REFILLS for any discharge medications will be authorized once you are discharged, as it is imperative that you return to your primary care physician (or establish a relationship with a primary care physician if you do not have one) for your aftercare needs so that they can reassess your need for medications and monitor your lab values. Today   SUBJECTIVE   Doing ok. No resp distress. Some cough with phlegm  VITAL SIGNS:  Blood pressure (!) 145/59, pulse 79, temperature 98.7 F (37.1 C), temperature source Oral, resp. rate 18, height 5\' 3"  (1.6 m), weight 76.1 kg, SpO2 92 %.  I/O:    Intake/Output Summary (Last 24 hours) at 09/29/2018 1125 Last data filed at 09/29/2018 1014 Gross per 24 hour  Intake 480 ml  Output -  Net 480 ml    PHYSICAL EXAMINATION:  GENERAL:  82 y.o.-year-old patient lying in the bed with no acute distress. obese EYES: Pupils equal, round, reactive to light and accommodation. No scleral icterus. Extraocular muscles intact.  HEENT: Head atraumatic, normocephalic. Oropharynx and nasopharynx clear.  NECK:  Supple, no jugular venous distention. No thyroid  enlargement, no tenderness.  LUNGS: Normal breath sounds bilaterally, no wheezing, rales,rhonchi or crepitation. No use of accessory muscles of respiration.  CARDIOVASCULAR: S1, S2 normal. No murmurs, rubs, or gallops.  ABDOMEN: Soft, non-tender, non-distended. Bowel sounds present. No organomegaly or mass.  EXTREMITIES: No pedal edema, cyanosis, or clubbing.  NEUROLOGIC: Cranial nerves II through XII are intact. Muscle strength 5/5 in all extremities. Sensation intact. Gait not checked.  PSYCHIATRIC: The patient is alert and oriented x 3.  SKIN: No obvious rash, lesion, or ulcer.   DATA REVIEW:   CBC  Recent Labs  Lab 09/28/18 1124  WBC 10.1  HGB 12.3  HCT 38.2  PLT 392    Chemistries  Recent Labs  Lab 09/28/18 1124  NA 140  K 4.2  CL 106  CO2 24  GLUCOSE 99  BUN 10  CREATININE 0.66  CALCIUM 8.9  AST 16  ALT 10  ALKPHOS 80  BILITOT 0.6    Microbiology Results   Recent  Results (from the past 240 hour(s))  MRSA PCR Screening     Status: None   Collection Time: 09/28/18  8:43 PM  Result Value Ref Range Status   MRSA by PCR NEGATIVE NEGATIVE Final    Comment:        The GeneXpert MRSA Assay (FDA approved for NASAL specimens only), is one component of a comprehensive MRSA colonization surveillance program. It is not intended to diagnose MRSA infection nor to guide or monitor treatment for MRSA infections. Performed at [facility], [address] [place], [place] [address]     RADIOLOGY:  Ct Angio Chest Pe W And/or Wo Contrast  Result Date: 09/28/2018 CLINICAL DATA:  cough and wheezing x 4 days. Pt was started on cough medicine at facility. Pt taking albuterol and duoneb upon arrival at ED. Pt has hx dementia, htn. EMS also indicates area of cellulitis on LLE. Pt alert, follows commands. EXAM: CT ANGIOGRAPHY CHEST WITH CONTRAST TECHNIQUE: Multidetector CT imaging of the chest was performed using the standard protocol during bolus administration  of intravenous contrast. Multiplanar CT image reconstructions and MIPs were obtained to evaluate the vascular anatomy. CONTRAST:  75mL OMNIPAQUE IOHEXOL 350 MG/ML SOLN COMPARISON:  None. FINDINGS: Cardiovascular: Mild four-chamber cardiac enlargement. No pericardial effusion. Satisfactory opacification of pulmonary arteries noted, and there is no evidence of pulmonary emboli. Adequate contrast opacification of the thoracic aorta with no evidence of dissection, aneurysm, or stenosis. There is classic 3-vessel brachiocephalic arch anatomy without proximal stenosis. Scattered calcified plaque in the arch. Mediastinum/Nodes: No hilar or mediastinal adenopathy. Lungs/Pleura: No pleural effusion. No pneumothorax. Subsegmental atelectasis laterally at the right lung base. Upper Abdomen: 2.4 cm low-attenuation lateral left hepatic segment lesion, incompletely characterized. Musculoskeletal: Thoracic kyphosis. Mild T7 superior endplate compression deformity, age indeterminate. Anterior vertebral endplate spurring at multiple levels in the mid and lower thoracic spine. Review of the MIP images confirms the above findings. IMPRESSION: 1. Negative for acute PE or thoracic aortic dissection. 2. 2.4 cm lateral left liver lesion, possibly benign cyst but incompletely characterized. Ultrasound may be useful for definitive characterization. Electronically Signed   By: Corlis Leak M.D.   On: 09/28/2018 16:25   Dg Chest Portable 1 View  Result Date: 09/28/2018 CLINICAL DATA:  Cough and wheezing for the past 4 days. Also shortness of breath. EXAM: PORTABLE CHEST 1 VIEW COMPARISON:  None. FINDINGS: The lungs are borderline hypoinflated. The interstitial markings are coarse. The cardiac silhouette is enlarged. The pulmonary vascularity is prominent. There is no pleural effusion. The bony thorax exhibits diffuse subjective osteopenia but no acute abnormality. There is calcification in the wall of the aortic arch. IMPRESSION: The  findings suggest low-grade CHF with mild interstitial edema. One cannot exclude interstitial pneumonia in the appropriate clinical setting. Thoracic aortic atherosclerosis. Electronically Signed   By: David  [place] M.D.   On: 09/28/2018 12:10     Management plans discussed with the patient, family and they are in agreement.  CODE STATUS:     Code Status Orders  (From admission, onward)         Start     Ordered   09/28/18 2001  Do not attempt resuscitation (DNR)  Continuous    Question Answer Comment  In the event of cardiac or respiratory ARREST Do not call a "code blue"   In the event of cardiac or respiratory ARREST Do not perform Intubation, CPR, defibrillation or ACLS   In the event of cardiac or respiratory ARREST Use medication by  any route, position, wound care, and other measures to relive pain and suffering. May use oxygen, suction and manual treatment of airway obstruction as needed for comfort.      09/28/18 2000        Code Status History    This patient has a current code status but no historical code status.      TOTAL TIME TAKING CARE OF THIS PATIENT: *40 minutes.    Enedina Finner M.D on 09/29/2018 at 11:25 AM  Between 7am to 6pm - Pager - [phone] After 6pm go to [email or web address] - Social research officer, government  [facility]  Office  [phone]  CC: Primary care physician; [facility]

## 2018-10-04 ENCOUNTER — Emergency Department: Payer: Medicare Other

## 2018-10-04 ENCOUNTER — Other Ambulatory Visit: Payer: Self-pay

## 2018-10-04 ENCOUNTER — Emergency Department
Admission: EM | Admit: 2018-10-04 | Discharge: 2018-10-04 | Disposition: A | Discharge status: Home / Self Care | Payer: Medicare Other | Attending: Emergency Medicine | Admitting: Emergency Medicine

## 2018-10-04 DIAGNOSIS — Y9389 Activity, other specified: Secondary | ICD-10-CM | POA: Diagnosis not present

## 2018-10-04 DIAGNOSIS — Z7902 Long term (current) use of antithrombotics/antiplatelets: Secondary | ICD-10-CM | POA: Insufficient documentation

## 2018-10-04 DIAGNOSIS — Y92129 Unspecified place in nursing home as the place of occurrence of the external cause: Secondary | ICD-10-CM | POA: Diagnosis not present

## 2018-10-04 DIAGNOSIS — I10 Essential (primary) hypertension: Secondary | ICD-10-CM | POA: Diagnosis not present

## 2018-10-04 DIAGNOSIS — W0110XA Fall on same level from slipping, tripping and stumbling with subsequent striking against unspecified object, initial encounter: Secondary | ICD-10-CM | POA: Diagnosis not present

## 2018-10-04 DIAGNOSIS — M25551 Pain in right hip: Secondary | ICD-10-CM | POA: Diagnosis not present

## 2018-10-04 DIAGNOSIS — R51 Headache: Secondary | ICD-10-CM | POA: Diagnosis not present

## 2018-10-04 DIAGNOSIS — F0390 Unspecified dementia without behavioral disturbance: Secondary | ICD-10-CM | POA: Diagnosis not present

## 2018-10-04 DIAGNOSIS — Z79899 Other long term (current) drug therapy: Secondary | ICD-10-CM | POA: Diagnosis not present

## 2018-10-04 DIAGNOSIS — Y999 Unspecified external cause status: Secondary | ICD-10-CM | POA: Insufficient documentation

## 2018-10-04 DIAGNOSIS — W19XXXA Unspecified fall, initial encounter: Secondary | ICD-10-CM

## 2018-10-04 NOTE — ED Notes (Signed)
[facility] here to transport pt back to facility.

## 2018-10-04 NOTE — ED Notes (Signed)
Spoke with daughter, Porfirio Oar, in reference to pt discharge and she advised pt does not have legal guardian. States she is pt's POA, not legal guardian.

## 2018-10-04 NOTE — ED Triage Notes (Signed)
Pt brought in by [facility] from [facility] due to mechanical fall 15 minutes prior to arrival. Pt was getting up from the breakfast table and tripped. She fell and hit her head on the chair. C/o head pain and right hip pain. Pt has hx of dementia but verbalizes pain to these areas. Fall was witnessed by staff.

## 2018-10-04 NOTE — ED Provider Notes (Signed)
[facility] Emergency Department Provider Note  Time seen: 8:36 AM  I have reviewed the triage vital signs and the nursing notes.   HISTORY  Chief Complaint Fall    HPI Jessica House is a 82 y.o. female with a past medical history of hypertension, CHF, dementia who presents to the emergency department after a fall.  According to EMS report patient was eating breakfast when she tripped and fell.  It was witnessed at the nursing facility.  They believe the patient did hit her head although denies loss of consciousness.  Was initially complaining of right hip pain and a headache.  Upon arrival to the emergency department patient denies any complaints denies any pain.  She is able to tell me that she fell but cannot tell me any surrounding events or further information.  History of dementia at baseline, unable to obtain an accurate review of systems.   Past Medical History:  Diagnosis Date  . Hypertension     Patient Active Problem List   Diagnosis Date Noted  . Acute respiratory failure with hypoxia (HCC) 09/28/2018  . Accelerated hypertension 09/28/2018  . Acute respiratory failure (HCC) 09/28/2018    No past surgical history on file.  Prior to Admission medications   Medication Sig Start Date End Date Taking? Authorizing Provider  acetaminophen (TYLENOL) 500 MG tablet Take 500 mg by mouth every 4 (four) hours as needed.    [provider]  alum & mag hydroxide-simeth (MINTOX REGULAR STRENGTH) 200-200-20 MG/5ML suspension Take 30 mLs by mouth every 6 (six) hours as needed for indigestion or heartburn.    [provider]  amLODipine (NORVASC) 10 MG tablet Take 10 mg by mouth daily.    [provider]  atorvastatin (LIPITOR) 10 MG tablet Take 10 mg by mouth daily.    [provider]  azithromycin (ZITHROMAX) 250 MG tablet Take daily as dirrected 09/29/18   Enedina Finner, MD  cholecalciferol (VITAMIN D) 1000 units tablet Take  1,000 Units by mouth daily.    [provider]  clopidogrel (PLAVIX) 75 MG tablet Take 75 mg by mouth daily.    [provider]  docusate sodium (COLACE) 100 MG capsule Take 100 mg by mouth 2 (two) times daily.    [provider]  donepezil (ARICEPT) 10 MG tablet Take 10 mg by mouth daily.    [provider]  ENSURE PLUS (ENSURE PLUS) LIQD Take 237 mLs by mouth 2 (two) times daily between meals.    [provider]  furosemide (LASIX) 20 MG tablet Take 20 mg by mouth daily.    [provider]  guaifenesin (ROBITUSSIN) 100 MG/5ML syrup Take 200 mg by mouth 4 (four) times daily as needed for cough.    [provider]  guaiFENesin-codeine 100-10 MG/5ML syrup Take 5 mLs by mouth every 6 (six) hours as needed for cough. 09/29/18   Enedina Finner, MD  lisinopril (PRINIVIL,ZESTRIL) 10 MG tablet Take 10 mg by mouth daily.    [provider]  magnesium hydroxide (MILK OF MAGNESIA) 400 MG/5ML suspension Take 30 mLs by mouth at bedtime as needed for mild constipation.    [provider]  neomycin-bacitracin-polymyxin (NEOSPORIN) [phone] ointment Apply 1 application topically 4 (four) times daily as needed. For minor skin tears or abrasions, clean area with normal saline, apply ointment and cover with bandaid or gauze. Change as needed until healed.    [provider]  potassium chloride SA (K-DUR,KLOR-CON) 20 MEQ tablet Take  20 mEq by mouth daily.    [provider]    Allergies  Allergen Reactions  . Orange Fruit [Citrus]     Orange juice    . Pineapple   . Pork Allergy     Family History  Family history unknown: Yes    Social History Social History   Tobacco Use  . Smoking status: Unknown If Ever Smoked  Substance Use Topics  . Alcohol use: Not Currently  . Drug use: Not Currently    Review of Systems Unable to obtain an accurate/adequate review of systems secondary to  dementia.  ____________________________________________   PHYSICAL EXAM:  VITAL SIGNS: ED Triage Vitals  Enc Vitals Group     BP 10/04/18 0832 (!) 156/61     Pulse Rate 10/04/18 0832 79     Resp 10/04/18 0832 14     Temp 10/04/18 0832 97.8 F (36.6 C)     Temp Source 10/04/18 0832 Oral     SpO2 10/04/18 0832 98 %     Weight 10/04/18 0833 167 lb 12.3 oz (76.1 kg)     Height 10/04/18 0833 5\' 3"  (1.6 m)     Head Circumference --      Peak Flow --      Pain Score --      Pain Loc --      Pain Edu? --      Excl. in GC? --    Constitutional: Alert, calm and cooperative, no distress. Eyes: Normal exam ENT   Head: Normocephalic and atraumatic.   Nose: No congestion/rhinnorhea.   Mouth/Throat: Mucous membranes are moist. Cardiovascular: Normal rate, regular rhythm. No murmurs, rubs, or gallops. Respiratory: Normal respiratory effort without tachypnea nor retractions. Breath sounds are clear  Gastrointestinal: Soft and nontender. No distention. Musculoskeletal: Nontender with normal range of motion in all extremities without pain elicited. Neurologic:  Normal speech and language. No gross focal neurologic deficits  Skin:  Skin is warm, dry and intact.  Psychiatric: Mood and affect are normal.   ____________________________________________   RADIOLOGY  Imaging is negative for acute abnormality  ____________________________________________   INITIAL IMPRESSION / ASSESSMENT AND PLAN / ED COURSE  Pertinent labs & imaging results that were available during my care of the patient were reviewed by me and considered in my medical decision making (see chart for details).  Patient presents to the emergency department after a fall.  Fall was witnessed by staff, believe the patient hit her head but denies LOC.  Here patient has no complaints but does have a history of dementia.  Has good range of motion in all extremities without any tenderness or pain elicited.  No signs of  trauma on head or neck exam.  Given the patient's age and fall we will obtain CT imaging of the head and neck as precaution and obtain a pelvis x-ray.  CT scans and pelvis x-ray are negative for acute fracture/abnormality.  Patient will be discharged back to her nursing facility.  ____________________________________________   FINAL CLINICAL IMPRESSION(S) / ED DIAGNOSES  Thresa Ross, MD 10/04/18 [phone]

## 2018-10-04 NOTE — ED Notes (Signed)
Verbal report called to [employer], [employer], at [facility].

## 2018-10-04 NOTE — ED Notes (Signed)
[facility]  Called for  Transport  To  [facility]

## 2018-10-06 ENCOUNTER — Emergency Department
Admission: EM | Admit: 2018-10-06 | Discharge: 2018-10-06 | Disposition: A | Discharge status: Home / Self Care | Payer: Medicare Other | Attending: Emergency Medicine | Admitting: Emergency Medicine

## 2018-10-06 ENCOUNTER — Emergency Department: Payer: Medicare Other

## 2018-10-06 ENCOUNTER — Encounter: Payer: Self-pay | Admitting: Emergency Medicine

## 2018-10-06 ENCOUNTER — Other Ambulatory Visit: Payer: Self-pay

## 2018-10-06 DIAGNOSIS — I10 Essential (primary) hypertension: Secondary | ICD-10-CM | POA: Insufficient documentation

## 2018-10-06 DIAGNOSIS — M1990 Unspecified osteoarthritis, unspecified site: Secondary | ICD-10-CM

## 2018-10-06 DIAGNOSIS — Z7902 Long term (current) use of antithrombotics/antiplatelets: Secondary | ICD-10-CM | POA: Diagnosis not present

## 2018-10-06 DIAGNOSIS — M810 Age-related osteoporosis without current pathological fracture: Secondary | ICD-10-CM | POA: Insufficient documentation

## 2018-10-06 DIAGNOSIS — Z79899 Other long term (current) drug therapy: Secondary | ICD-10-CM | POA: Insufficient documentation

## 2018-10-06 DIAGNOSIS — F0390 Unspecified dementia without behavioral disturbance: Secondary | ICD-10-CM | POA: Insufficient documentation

## 2018-10-06 DIAGNOSIS — F03C0 Unspecified dementia, severe, without behavioral disturbance, psychotic disturbance, mood disturbance, and anxiety: Secondary | ICD-10-CM

## 2018-10-06 DIAGNOSIS — M545 Low back pain: Secondary | ICD-10-CM | POA: Diagnosis present

## 2018-10-06 LAB — CBC WITH DIFFERENTIAL/PLATELET
ABS IMMATURE GRANULOCYTES: 0.16 10*3/uL — AB (ref 0.00–0.07)
Basophils Absolute: 0.1 10*3/uL (ref 0.0–0.1)
Basophils Relative: 1 %
Eosinophils Absolute: 0.1 10*3/uL (ref 0.0–0.5)
Eosinophils Relative: 1 %
HEMATOCRIT: 41.9 % (ref 36.0–46.0)
Hemoglobin: 13 g/dL (ref 12.0–15.0)
IMMATURE GRANULOCYTES: 1 %
LYMPHS ABS: 2.4 10*3/uL (ref 0.7–4.0)
LYMPHS PCT: 16 %
MCH: 25 pg — ABNORMAL LOW (ref 26.0–34.0)
MCHC: 31 g/dL (ref 30.0–36.0)
MCV: 80.7 fL (ref 80.0–100.0)
MONOS PCT: 8 %
Monocytes Absolute: 1.2 10*3/uL — ABNORMAL HIGH (ref 0.1–1.0)
NEUTROS ABS: 11.2 10*3/uL — AB (ref 1.7–7.7)
NEUTROS PCT: 73 %
PLATELETS: 357 10*3/uL (ref 150–400)
RBC: 5.19 MIL/uL — ABNORMAL HIGH (ref 3.87–5.11)
RDW: 14.1 % (ref 11.5–15.5)
WBC: 15.1 10*3/uL — ABNORMAL HIGH (ref 4.0–10.5)
nRBC: 0 % (ref 0.0–0.2)

## 2018-10-06 LAB — COMPREHENSIVE METABOLIC PANEL
ALT: 13 U/L (ref 0–44)
AST: 24 U/L (ref 15–41)
Albumin: 3.5 g/dL (ref 3.5–5.0)
Alkaline Phosphatase: 79 U/L (ref 38–126)
Anion gap: 9 (ref 5–15)
BILIRUBIN TOTAL: 1.1 mg/dL (ref 0.3–1.2)
BUN: 19 mg/dL (ref 8–23)
CHLORIDE: 108 mmol/L (ref 98–111)
CO2: 27 mmol/L (ref 22–32)
CREATININE: 0.87 mg/dL (ref 0.44–1.00)
Calcium: 8.9 mg/dL (ref 8.9–10.3)
GFR calc Af Amer: >60 mL/min (ref >60)
Glucose, Bld: 113 mg/dL — ABNORMAL HIGH (ref 70–99)
Potassium: 4.5 mmol/L (ref 3.5–5.1)
Sodium: 144 mmol/L (ref 135–145)
Total Protein: 8.5 g/dL — ABNORMAL HIGH (ref 6.5–8.1)

## 2018-10-06 LAB — URINALYSIS, COMPLETE (UACMP) WITH MICROSCOPIC
Bacteria, UA: NONE SEEN
Bilirubin Urine: NEGATIVE
GLUCOSE, UA: NEGATIVE mg/dL
Hgb urine dipstick: NEGATIVE
Ketones, ur: NEGATIVE mg/dL
Leukocytes, UA: NEGATIVE
Nitrite: NEGATIVE
PROTEIN: NEGATIVE mg/dL
Specific Gravity, Urine: 1.011 (ref 1.005–1.030)
pH: 7 (ref 5.0–8.0)

## 2018-10-06 LAB — TROPONIN I

## 2018-10-06 MED ORDER — SODIUM CHLORIDE 0.9 % IV BOLUS: 500.0000 mL | Freq: Once | INTRAVENOUS | Status: DC

## 2018-10-06 NOTE — ED Provider Notes (Signed)
[facility] Emergency Department Provider Note  ____________________________________________  Time seen: Approximately 8:40 AM  I have reviewed the triage vital signs and the nursing notes.   HISTORY  Chief Complaint Back Pain  Level 5 Caveat: Portions of the History and Physical including HPI and review of systems are unable to be completely obtained due to patient being a poor historian due to chronic dementia   HPI Jessica House is a 82 y.o. female with a history of advanced dementia and hypertension who sent to the ED today due to pain in unspecified location.  Her nursing facility, [facility], thought that she had low back pain.  She had a fall 2 days ago was seen in the ED, had negative CT head and neck and was sent back home, but complaining of worsening pain that is aggravated by standing up and leaning forward.  No alleviating factors, patient is unable to provide any further details.  Unclear if it is radiating or even the location.  She is at different times that her left side, lower back, and right hip.  No recurrent falls since 2 days ago.      Past Medical History:  Diagnosis Date  . Hypertension      Patient Active Problem List   Diagnosis Date Noted  . Acute respiratory failure with hypoxia (HCC) 09/28/2018  . Accelerated hypertension 09/28/2018  . Acute respiratory failure (HCC) 09/28/2018     History reviewed. No pertinent surgical history.   Prior to Admission medications   Medication Sig Start Date End Date Taking? Authorizing Provider  acetaminophen (TYLENOL) 500 MG tablet Take 500 mg by mouth every 4 (four) hours as needed for mild pain, fever or headache.    Yes [provider]  alum & mag hydroxide-simeth (MINTOX REGULAR STRENGTH) 200-200-20 MG/5ML suspension Take 30 mLs by mouth daily as needed for indigestion or heartburn.    Yes [provider]  amLODipine (NORVASC) 10 MG tablet Take 10 mg by mouth daily.    Yes [provider]  atorvastatin (LIPITOR) 10 MG tablet Take 10 mg by mouth at bedtime.    Yes [provider]  cholecalciferol (VITAMIN D) 1000 units tablet Take 1,000 Units by mouth daily.   Yes [provider]  clopidogrel (PLAVIX) 75 MG tablet Take 75 mg by mouth daily.   Yes [provider]  docusate sodium (COLACE) 100 MG capsule Take 100 mg by mouth 2 (two) times daily.   Yes [provider]  donepezil (ARICEPT) 10 MG tablet Take 10 mg by mouth daily.   Yes [provider]  furosemide (LASIX) 20 MG tablet Take 20 mg by mouth daily.   Yes [provider]  guaifenesin (ROBITUSSIN) 100 MG/5ML syrup Take 200 mg by mouth every 6 (six) hours as needed for cough.    Yes [provider]  guaiFENesin-codeine 100-10 MG/5ML syrup Take 5 mLs by mouth every 6 (six) hours as needed for cough. 09/29/18  Yes Enedina Finner, MD  lisinopril (PRINIVIL,ZESTRIL) 10 MG tablet Take 10 mg by mouth daily.   Yes [provider]  magnesium hydroxide (MILK OF MAGNESIA) 400 MG/5ML suspension Take 30 mLs by mouth at bedtime as needed for mild constipation.   Yes [provider]  neomycin-bacitracin-polymyxin (NEOSPORIN) [phone] ointment Apply 1 application topically 4 (four) times daily as needed (for minor skin tears or abrasions).    Yes [provider]  potassium chloride SA (K-DUR,KLOR-CON) 20 MEQ tablet Take 20 mEq by  mouth daily.   Yes [provider]  azithromycin (ZITHROMAX) 250 MG tablet Take daily as dirrected Patient not taking: Reported on 10/06/2018 09/29/18   Enedina Finner, MD     Allergies Orange fruit [citrus]; Pineapple; and Pork allergy   Family History  Family history unknown: Yes    Social History Social History   Tobacco Use  . Smoking status: Unknown If Ever Smoked  Substance Use Topics  . Alcohol use: Not Currently  . Drug use: Not Currently    Review of Systems  Level 5  Caveat: Portions of the History and Physical including HPI and review of systems are unable to be completely obtained due to patient being a poor historian  ____________________________________________   PHYSICAL EXAM:  VITAL SIGNS: ED Triage Vitals  Enc Vitals Group     BP --      Pulse --      Resp --      Temp 10/06/18 0838 98 F (36.7 C)     Temp Source 10/06/18 0838 Oral     SpO2 --      Weight 10/06/18 0723 167 lb 12.3 oz (76.1 kg)     Height 10/06/18 0723 5\' 3"  (1.6 m)     Head Circumference --      Peak Flow --      Pain Score --      Pain Loc --      Pain Edu? --      Excl. in GC? --     Vital signs reviewed, nursing assessments reviewed.   Constitutional:   Alert and oriented to self. Non-toxic appearance. Eyes:   Conjunctivae are normal. EOMI. PERRL. ENT      Head:   Normocephalic and atraumatic.      Nose:   No congestion/rhinnorhea.       Mouth/Throat:   MMM, no pharyngeal erythema. No peritonsillar mass.       Neck:   No meningismus. Full ROM. Hematological/Lymphatic/Immunilogical:   No cervical lymphadenopathy. Cardiovascular:   RRR. Symmetric bilateral radial and DP pulses.  No murmurs. Cap refill less than 2 seconds. Respiratory:   Normal respiratory effort without tachypnea/retractions. Breath sounds are clear and equal bilaterally. No wheezes/rales/rhonchi. Gastrointestinal:   Soft and nontender. Non distended. There is no CVA tenderness.  No rebound, rigidity, or guarding.  Musculoskeletal:   Normal range of motion in all extremities. No joint effusions.  No lower extremity tenderness.  No edema.  Joints stable and without focal tenderness.  There is vague pain with palpation diffusely around the right knee and lower back and chest wall and with flexion of the right hip. Neurologic:   Normal speech, very limited language range Motor grossly intact. No acute focal neurologic deficits are appreciated.  Skin:    Skin is warm, dry and intact. No rash  noted.  No petechiae, purpura, or bullae.  ____________________________________________    LABS (pertinent positives/negatives) (all labs ordered are listed, but only abnormal results are displayed) Labs Reviewed  CBC WITH DIFFERENTIAL/PLATELET - Abnormal; Notable for the following components:      Result Value   WBC 15.1 (*)    RBC 5.19 (*)    MCH 25.0 (*)    Neutro Abs 11.2 (*)    Monocytes Absolute 1.2 (*)    Abs Immature Granulocytes 0.16 (*)    All other components within normal limits  COMPREHENSIVE METABOLIC PANEL - Abnormal; Notable for the following components:   Glucose, Bld 113 (*)  Total Protein 8.5 (*)    All other components within normal limits  URINALYSIS, COMPLETE (UACMP) WITH MICROSCOPIC - Abnormal; Notable for the following components:   Color, Urine STRAW (*)    APPearance CLEAR (*)    All other components within normal limits  URINE CULTURE  TROPONIN I   ____________________________________________   EKG  Interpreted by me Sinus rhythm rate of 91, normal axis and intervals.  Normal QRS and ST segments.  T wave inversions in aVF, V3, V4, and V6.  Nonischemic in appearance, but no prior EKGs for comparison.  ____________________________________________    RADIOLOGY  Dg Chest 1 View  Result Date: 10/06/2018 CLINICAL DATA:  Pain following recent fall EXAM: CHEST  1 VIEW COMPARISON:  Chest radiograph and chest CT September 28, 2018 FINDINGS: There is generalized interstitial thickening. There is mild cardiomegaly with pulmonary venous hypertension. There is no consolidation. No pneumothorax. Bones are diffusely osteoporotic. No acute fracture evident. No adenopathy. IMPRESSION: Question a degree of chronic congestive heart failure. Suspect a degree of interstitial edema superimposed on pulmonary vascular congestion. No consolidation. Bones osteoporotic.  No pneumothorax. Electronically Signed   By: Bretta Bang III M.D.   On: 10/06/2018 08:14   Dg  Lumbar Spine 2-3 Views  Result Date: 10/06/2018 CLINICAL DATA:  Pain following recent fall EXAM: LUMBAR SPINE - 2-3 VIEW COMPARISON:  None. FINDINGS: Frontal, lateral, and spot lumbosacral lateral images were obtained. There are 5 non-rib-bearing lumbar type vertebral bodies. There is no acute fracture or spondylolisthesis. There is moderate disc space narrowing at L5-S1. Other disc spaces appear unremarkable. No erosive change. Bones are diffusely osteoporotic. IMPRESSION: Bones osteoporotic. Osteoarthritic change at L5-S1. No evident fracture or spondylolisthesis. Electronically Signed   By: Bretta Bang III M.D.   On: 10/06/2018 08:17   Dg Pelvis 1-2 Views  Result Date: 10/06/2018 CLINICAL DATA:  Pain following fall 2 days prior EXAM: PELVIS - 1-2 VIEW COMPARISON:  October 04, 2018 FINDINGS: Bones are osteoporotic. There is no acute fracture or dislocation. Joint spaces appear unremarkable. No erosive change. IMPRESSION: No evident fracture or dislocation. The joint spaces appear unremarkable. Bones are diffusely osteoporotic. Electronically Signed   By: Bretta Bang III M.D.   On: 10/06/2018 08:13   Dg Knee Complete 4 Views Right  Result Date: 10/06/2018 CLINICAL DATA:  Pain following recent fall EXAM: RIGHT KNEE - COMPLETE 4+ VIEW COMPARISON:  None. FINDINGS: Frontal, lateral, and bilateral oblique views were obtained. Bones are diffusely osteoporotic. There is no appreciable acute fracture or dislocation. No joint effusion. There is generalized joint space narrowing, severe. There is spurring in all compartments. No erosive changes. IMPRESSION: Extensive osteoarthritic change involving all compartments. Bones diffusely osteoporotic. No evident fracture or dislocation. No joint effusion. Electronically Signed   By: Bretta Bang III M.D.   On: 10/06/2018 08:16     ____________________________________________   PROCEDURES Procedures  ____________________________________________    CLINICAL IMPRESSION / ASSESSMENT AND PLAN / ED COURSE  Pertinent labs & imaging results that were available during my care of the patient were reviewed by me and considered in my medical decision making (see chart for details).    Patient presents with musculoskeletal pain complaints after a fall 2 days ago.  No clear injury.  No wounds.  Check x-rays of chest low back right hip right knee to evaluate for an occult fracture.  Most likely strain and osteoarthritis related.  We will also check chest x-ray urinalysis and labs.  Presentation is not concerning  for ACS, but with abnormal EKG we will check a troponin for screening measures.  Clinical Course as of Oct 06 1041  Wed Oct 06, 2018  1041 Work-up negative.  Leukocytosis of unclear significance given clear urine, normal labs, normal x-rays.  Follow-up with primary care.   [PS]    Clinical Course User Index [PS] Sharman Cheek, MD     ____________________________________________   FINAL CLINICAL IMPRESSION(S) / ED DIAGNOSES    Final diagnoses:  Advanced dementia (HCC)  Age-related osteoporosis without current pathological fracture  Osteoarthritis, unspecified osteoarthritis type, unspecified site     ED Discharge Orders    None      Portions of this note were generated with dragon dictation software. Dictation errors may occur despite best attempts at proofreading.    Sharman Cheek, MD 10/06/18 [phone]

## 2018-10-06 NOTE — ED Notes (Signed)
Attempted IV x 1 without success.  

## 2018-10-06 NOTE — ED Notes (Signed)
Report given to EMS - attempted to call report to [facility] and they did not answer phone Pt unable to sign for discharge due to dementia and comes from memory unit

## 2018-10-06 NOTE — ED Triage Notes (Signed)
Pt presents from [facility] to ED via [facility] with c/o back pain. Pt also c/o of right leg pain to EMS as well. Pt had recent fall 2 days ago and was evaluated at [facility] ED. Pt has hx of Alzheimer's.

## 2018-10-06 NOTE — Discharge Instructions (Signed)
Take Tylenol as needed for your pain.  Follow-up with your doctor this week to continue monitoring your symptoms.

## 2018-10-06 NOTE — ED Notes (Signed)
In and out cath for urine obtained - 2nd Pattricia Boss RN

## 2018-10-06 NOTE — ED Notes (Addendum)
Per Dr Scotty Court hold IV fluids until all lab results back d/t pt is a difficult stick

## 2018-10-06 NOTE — ED Notes (Signed)
[facility]  CALLED  FOR  TRANSPORT 

## 2018-10-07 LAB — URINE CULTURE: Culture: NO GROWTH

## 2019-05-03 IMAGING — CT CT CERVICAL SPINE W/O CM
4 of 9 series · 10 of 33 positions shown, 11 images · non-contrast
Comparison: None.

CLINICAL DATA: 82-year-old female post mechanical fall. Fell and
hit head on chair. Dementia. No reported loss of consciousness.
Initial encounter.

EXAM:
CT HEAD WITHOUT CONTRAST
CT CERVICAL SPINE WITHOUT CONTRAST
TECHNIQUE: Multidetector CT imaging of the head and cervical spine was
performed following the standard protocol without intravenous
contrast. Multiplanar CT image reconstructions of the cervical spine
were also generated.

[Series 3: head bone · axial · 0.42mm/px · z∈[+274,+348]mm · 3 of 75 slices shown]
[im 19/75  bone]
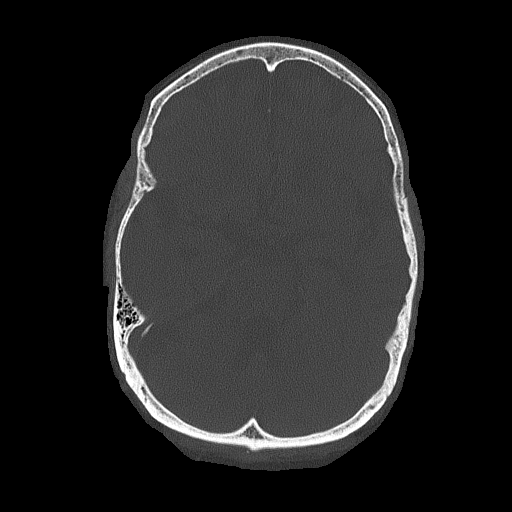
[im 38/75  bone]
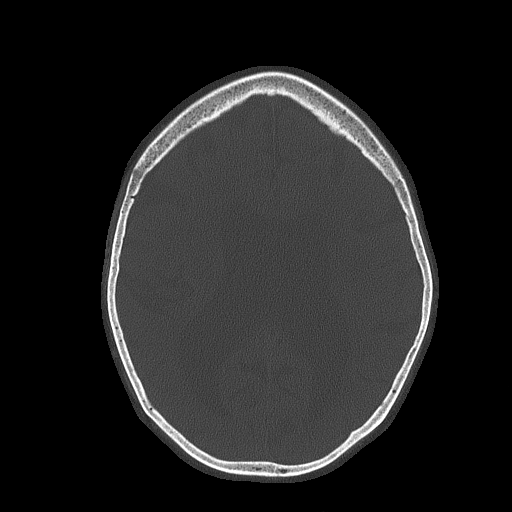
[im 56/75  bone]
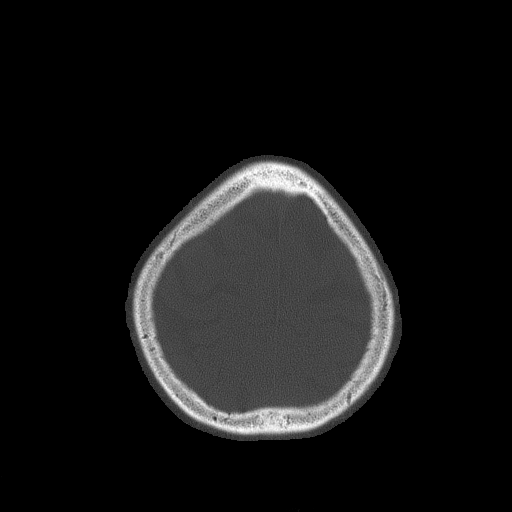

[Series 7: coronal soft tissue · coronal · 0.30mm/px · 2 of 60 slices shown]
[im 20/60  bone]
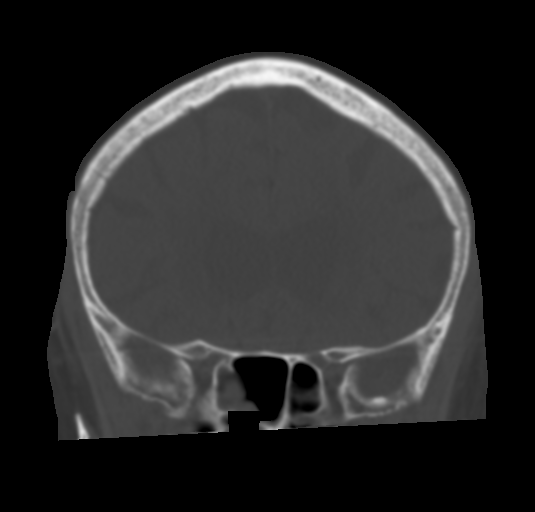
[im 40/60  bone]
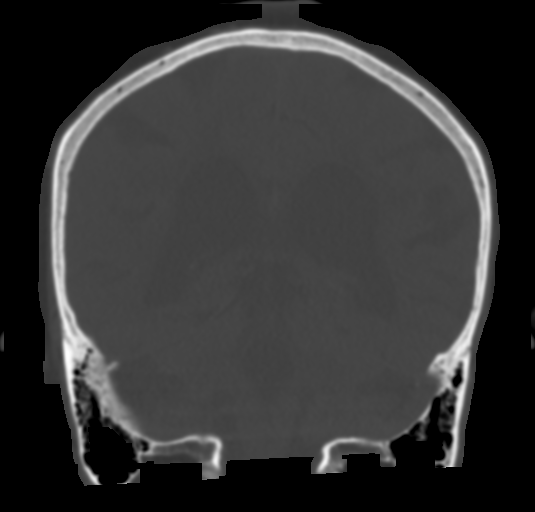

[Series 9: sagittal bone · sagittal · 0.20mm/px · 3 of 30 slices shown]
[im 8/30  bone]
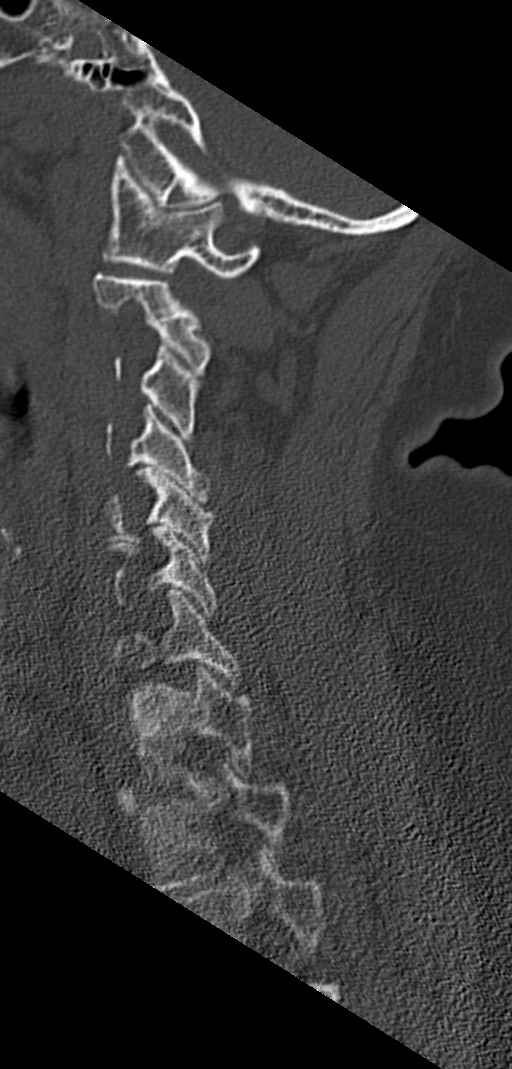
[im 15/30  bone]
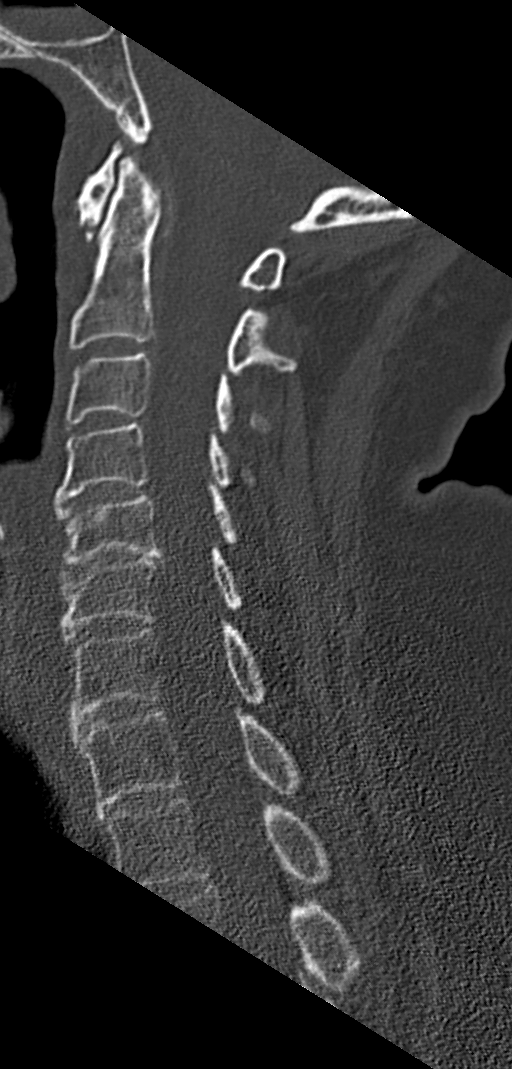
[im 22/30  bone]
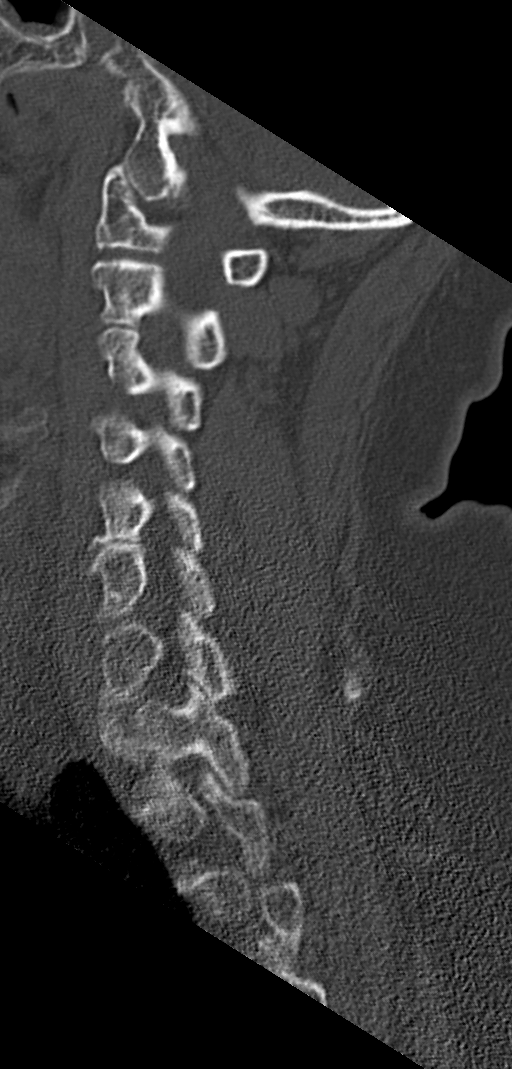

[Series 11: orthogonal bone · axial · 0.19mm/px · z∈[+125,+174]mm · 2 of 87 slices shown, 3 images]
[im 29/87  soft-tissue]
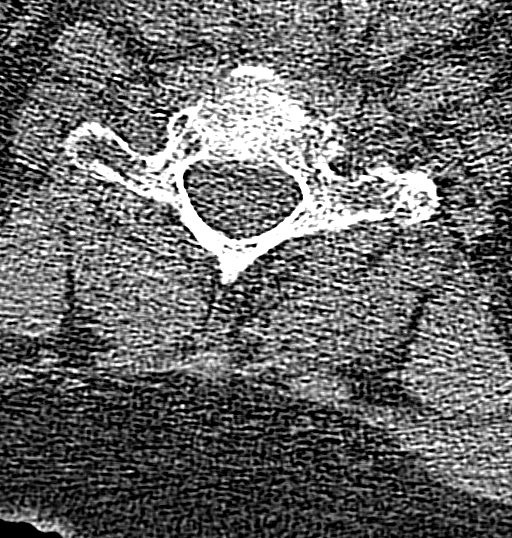
[im 29/87  bone]
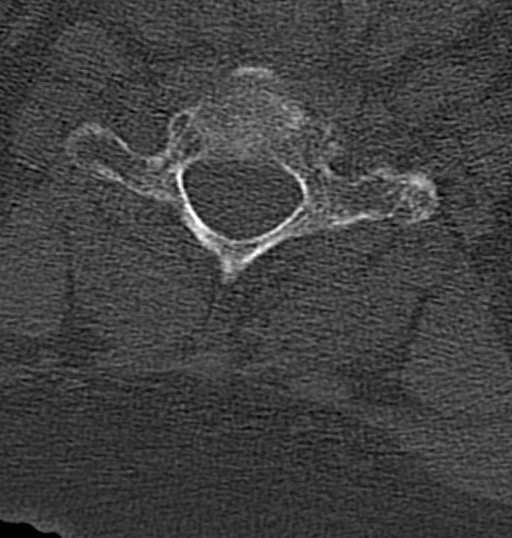
[im 58/87  bone]
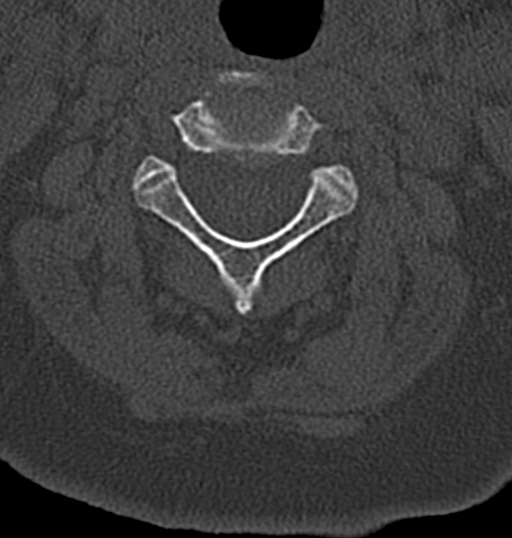

[10 of 33 positions shown; findings below may reference images not displayed]

FINDINGS: CT HEAD FINDINGS

Brain: No intracranial hemorrhage or CT evidence of large acute
infarct. Prominent chronic microvascular changes. Remote right
thalamic infarct. Global atrophy. No intracranial mass lesion noted
on this unenhanced exam.

Vascular: No hyperdense vessel.

Skull: No skull fracture.

Sinuses/Orbits: No acute orbital abnormality. Mucosal thickening
sphenoid sinus air cells and posterior ethmoid sinus air cells.

Other: Mastoid air cells and middle ear cavities are clear.

CT CERVICAL SPINE FINDINGS

Alignment: Minimal curvature.  Minimal retrolisthesis C5.

Skull base and vertebrae: No cervical spine fracture.

Soft tissues and spinal canal: No abnormal prevertebral soft tissue
swelling.

Disc levels:  Cervical spondylotic changes most notable C5-6 level.

Upper chest: No lung apical mass.

Other: No worrisome neck mass.
IMPRESSION: 1. No skull fracture or intracranial hemorrhage.
2. No cervical spine fracture or abnormal prevertebral soft tissue
swelling.
3. Mucosal thickening sphenoid sinus air cells and posterior ethmoid
sinus air cells.
4. Chronic changes as detailed above.

## 2019-05-05 IMAGING — CR DG CHEST 1V
1 series · 1 of 1 positions shown · non-contrast
Comparison: Chest radiograph and chest CT September 28, 2018

CLINICAL DATA: Pain following recent fall

EXAM:
CHEST  1 VIEW

[dg chest 1 view]
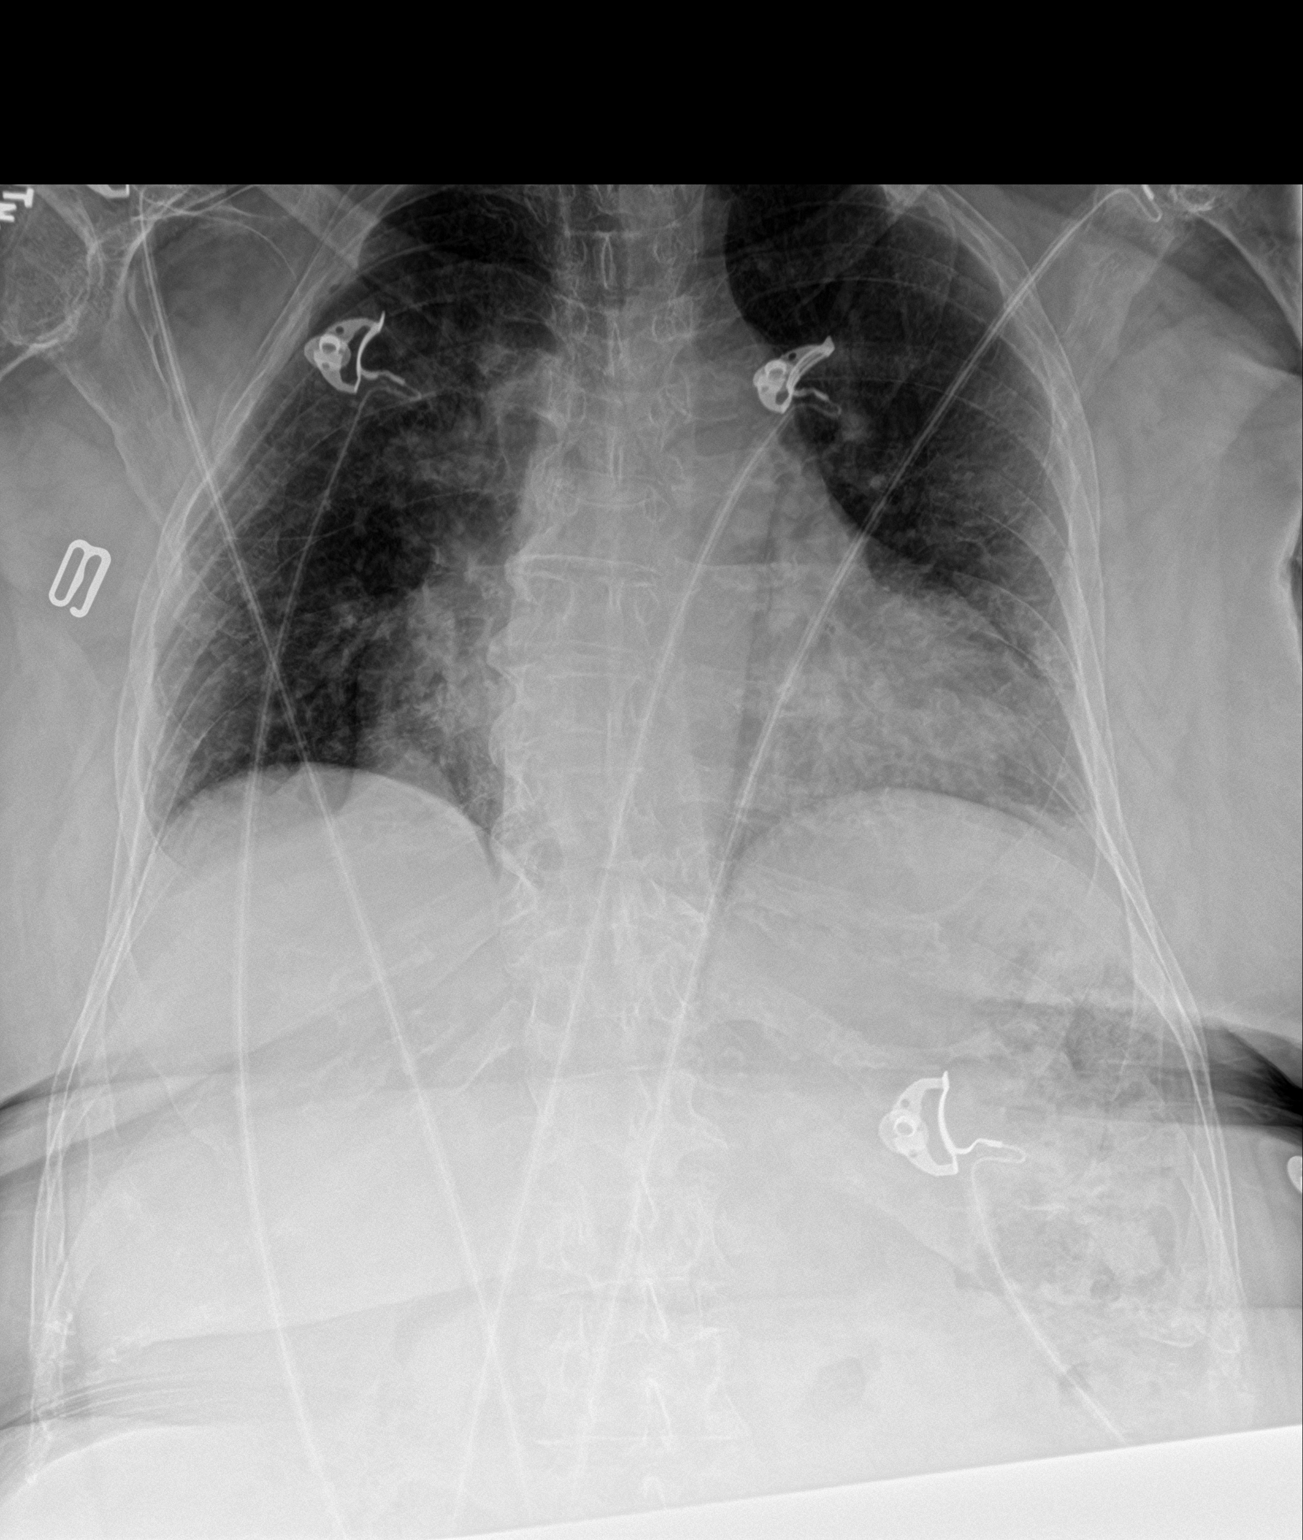

[1 of 1 positions shown; findings below may reference images not displayed]

FINDINGS: There is generalized interstitial thickening. There is mild
cardiomegaly with pulmonary venous hypertension. There is no
consolidation. No pneumothorax. Bones are diffusely osteoporotic. No
acute fracture evident. No adenopathy.
IMPRESSION: Question a degree of chronic congestive heart failure. Suspect a
degree of interstitial edema superimposed on pulmonary vascular
congestion. No consolidation.

Bones osteoporotic.  No pneumothorax.

## 2019-06-02 ENCOUNTER — Emergency Department
Admission: EM | Admit: 2019-06-02 | Discharge: 2019-06-02 | Disposition: A | Discharge status: Home / Self Care | Payer: Medicare Other | Attending: Emergency Medicine | Admitting: Emergency Medicine

## 2019-06-02 ENCOUNTER — Encounter: Payer: Self-pay | Admitting: Emergency Medicine

## 2019-06-02 ENCOUNTER — Emergency Department: Payer: Medicare Other

## 2019-06-02 ENCOUNTER — Other Ambulatory Visit: Payer: Self-pay

## 2019-06-02 DIAGNOSIS — Y92129 Unspecified place in nursing home as the place of occurrence of the external cause: Secondary | ICD-10-CM | POA: Diagnosis not present

## 2019-06-02 DIAGNOSIS — Y939 Activity, unspecified: Secondary | ICD-10-CM | POA: Diagnosis not present

## 2019-06-02 DIAGNOSIS — I10 Essential (primary) hypertension: Secondary | ICD-10-CM | POA: Diagnosis not present

## 2019-06-02 DIAGNOSIS — Z7902 Long term (current) use of antithrombotics/antiplatelets: Secondary | ICD-10-CM | POA: Diagnosis not present

## 2019-06-02 DIAGNOSIS — Z79899 Other long term (current) drug therapy: Secondary | ICD-10-CM | POA: Diagnosis not present

## 2019-06-02 DIAGNOSIS — X58XXXA Exposure to other specified factors, initial encounter: Secondary | ICD-10-CM | POA: Diagnosis not present

## 2019-06-02 DIAGNOSIS — Y999 Unspecified external cause status: Secondary | ICD-10-CM | POA: Insufficient documentation

## 2019-06-02 DIAGNOSIS — S32000A Wedge compression fracture of unspecified lumbar vertebra, initial encounter for closed fracture: Secondary | ICD-10-CM | POA: Insufficient documentation

## 2019-06-02 DIAGNOSIS — F0390 Unspecified dementia without behavioral disturbance: Secondary | ICD-10-CM | POA: Insufficient documentation

## 2019-06-02 DIAGNOSIS — S3992XA Unspecified injury of lower back, initial encounter: Secondary | ICD-10-CM | POA: Diagnosis present

## 2019-06-02 MED ORDER — TRAMADOL HCL 50 MG PO TABS: 50.0000 mg | ORAL_TABLET | Freq: Four times a day (QID) | ORAL | 0 refills | Status: AC | PRN

## 2019-06-02 MED ORDER — TRAMADOL HCL 50 MG PO TABS
50.0000 mg | ORAL_TABLET | Freq: Once | ORAL | Status: AC
  Administered 2019-06-02: 11:00:00 50 mg via ORAL
  Filled 2019-06-02: qty 1

## 2019-06-02 NOTE — ED Triage Notes (Signed)
Arrives from [facility] with c/o low back pain x 3 days.   Per EMS, staff have given patient tylenol #3 with no relief.  Patient is on the memory care unit and mental status is to baseline per staff report to EMS

## 2019-06-02 NOTE — ED Notes (Signed)
Patient transported to X-ray 

## 2019-06-02 NOTE — ED Provider Notes (Signed)
[facility] Emergency Department Provider Note   ____________________________________________    I have reviewed the triage vital signs and the nursing notes.   HISTORY  Chief Complaint Back Pain     HPI Jessica House is a 83 y.o. female who presents with reports of back pain.  Patient has significant advanced dementia and is unable to provide any history.  Sent from nursing home for evaluation of complaints of back pain.  No reports of recent falls  Past Medical History:  Diagnosis Date  . Hypertension     Patient Active Problem List   Diagnosis Date Noted  . Acute respiratory failure with hypoxia ([place]) 09/28/2018  . Accelerated hypertension 09/28/2018  . Acute respiratory failure ([place]) 09/28/2018    History reviewed. No pertinent surgical history.  Prior to Admission medications   Medication Sig Start Date End Date Taking? Authorizing Provider  acetaminophen (TYLENOL) 500 MG tablet Take 500 mg by mouth every 4 (four) hours as needed for mild pain, fever or headache.     [provider]  alum & mag hydroxide-simeth (MINTOX REGULAR STRENGTH) 200-200-20 MG/5ML suspension Take 30 mLs by mouth daily as needed for indigestion or heartburn.     [provider]  amLODipine (NORVASC) 10 MG tablet Take 10 mg by mouth daily.    [provider]  atorvastatin (LIPITOR) 10 MG tablet Take 10 mg by mouth at bedtime.     [provider]  azithromycin (ZITHROMAX) 250 MG tablet Take daily as dirrected Patient not taking: Reported on 10/06/2018 09/29/18   Fritzi Mandes, MD  cholecalciferol (VITAMIN D) 1000 units tablet Take 1,000 Units by mouth daily.    [provider]  clopidogrel (PLAVIX) 75 MG tablet Take 75 mg by mouth daily.    [provider]  docusate sodium (COLACE) 100 MG capsule Take 100 mg by mouth 2 (two) times daily.    [provider]  donepezil (ARICEPT) 10 MG tablet Take 10 mg by mouth  daily.    [provider]  furosemide (LASIX) 20 MG tablet Take 20 mg by mouth daily.    [provider]  guaifenesin (ROBITUSSIN) 100 MG/5ML syrup Take 200 mg by mouth every 6 (six) hours as needed for cough.     [provider]  guaiFENesin-codeine 100-10 MG/5ML syrup Take 5 mLs by mouth every 6 (six) hours as needed for cough. 09/29/18   Fritzi Mandes, MD  lisinopril (PRINIVIL,ZESTRIL) 10 MG tablet Take 10 mg by mouth daily.    [provider]  magnesium hydroxide (MILK OF MAGNESIA) 400 MG/5ML suspension Take 30 mLs by mouth at bedtime as needed for mild constipation.    [provider]  neomycin-bacitracin-polymyxin (NEOSPORIN) [phone] ointment Apply 1 application topically 4 (four) times daily as needed (for minor skin tears or abrasions).     [provider]  potassium chloride SA (K-DUR,KLOR-CON) 20 MEQ tablet Take 20 mEq by mouth daily.    [provider]  traMADol (ULTRAM) 50 MG tablet Take 1 tablet (50 mg total) by mouth every 6 (six) hours as needed. 06/02/19 06/01/20  Lavonia Drafts, MD     Allergies Orange fruit [citrus], Pineapple, and Pork allergy  Family History  Family history unknown: Yes    Social History Social History   Tobacco Use  . Smoking status: Unknown If Ever Smoked  Substance Use Topics  . Alcohol use: Not Currently  . Drug use: Not Currently    Unable to obtain  review of Systems     ____________________________________________   PHYSICAL EXAM:  VITAL SIGNS: ED Triage Vitals  Enc Vitals Group     BP 06/02/19 1036 (!) 186/73     Pulse Rate 06/02/19 1036 88     Resp 06/02/19 1036 17     Temp 06/02/19 1036 98.4 F (36.9 C)     Temp Source 06/02/19 1036 Oral     SpO2 06/02/19 1036 98 %     Weight 06/02/19 1033 76.1 kg (167 lb 12.3 oz)     Height --      Head Circumference --      Peak Flow --      Pain Score --      Pain Loc --      Pain Edu? --      Excl. in GC? --      Constitutional: Alert   Head: Atraumatic.  Mouth/Throat: Mucous membranes are moist.   Neck:  Painless ROM Cardiovascular: Normal rate, regular rhythm. Peri [place].  Good peripheral circulation. Respiratory: Normal respiratory effort.  No retractions.  Gastrointestinal: Soft and nontender. No distention.  No CVA tenderness.  Musculoskeletal: Back: No vertebral tenderness palpation, bruising or swelling noted.  Patient does cry out when being moved.  Strength in the lower extremities is normal.  Reflexes are normal Neurologic:  No gross focal neurologic deficits are appreciated.  Skin:  Skin is warm, dry and intact. No rash noted.   ____________________________________________   LABS (all labs ordered are listed, but only abnormal results are displayed)  Labs Reviewed - No data to display ____________________________________________  EKG  ED ECG REPORT I, Jene Everyobert Kasee Hantz, the attending physician, personally viewed and interpreted this ECG.  Date: 06/02/2019  Rhythm: normal sinus rhythm QRS Axis: normal Intervals: normal ST/T Wave abnormalities: normal Narrative Interpretation: no evidence of acute ischemia  ____________________________________________  RADIOLOGY  Demonstrates new compression fracture deformities L3 L5 ____________________________________________   PROCEDURES  Procedure(s) performed: No  Procedures   Critical Care performed: No ____________________________________________   INITIAL IMPRESSION / ASSESSMENT AND PLAN / ED COURSE  Pertinent labs & imaging results that were available during my care of the patient were reviewed by me and considered in my medical decision making (see chart for details).  Patient presents with reports of back pain, difficult to confirm this given her advanced dementia.  However reviewing her record she does have a history of falls, no falls reported recently we will obtain x-ray of the lumbar spine  X-ray demonstrates  compression fractures as above, neuro exam is normal, normal strength in the lower extremities.  Recommend close follow-up with Dr. Rosita KeaMenz in case kyphoplasty is indicated, patient had significant improvement with tramadol here   ____________________________________________   FINAL CLINICAL IMPRESSION(S) / ED DIAGNOSES  Final diagnoses:  Compression fracture of lumbar vertebra, initial encounter, unspecified lumbar vertebral level (HCC)        Note:  This document was prepared using Dragon voice recognition software and may include unintentional dictation errors.   Jene EveryKinner, Dmitriy Gair, MD 06/02/19 [phone]

## 2019-06-02 NOTE — ED Notes (Signed)
[facility]  CALLED  FOR  TRANSPORT 

## 2019-06-02 NOTE — ED Notes (Signed)
AAOx3.  Skin warm and dry.  NAD 

## 2019-06-02 NOTE — Discharge Instructions (Signed)
Patient has 2 compression fractures of lumbar spine. These are typically non-operative and controlled with pain medication. Please have her follow up with Dr. Rudene Christians of orthopedics

## 2019-06-02 NOTE — ED Notes (Signed)
Awake and alert to baseline.  NAD.  

## 2019-06-24 ENCOUNTER — Other Ambulatory Visit: Payer: Self-pay | Admitting: Orthopedic Surgery

## 2019-06-24 DIAGNOSIS — S32050A Wedge compression fracture of fifth lumbar vertebra, initial encounter for closed fracture: Secondary | ICD-10-CM

## 2019-06-24 DIAGNOSIS — S32030A Wedge compression fracture of third lumbar vertebra, initial encounter for closed fracture: Secondary | ICD-10-CM

## 2019-07-07 ENCOUNTER — Ambulatory Visit: Admission: RE | Admit: 2019-07-07 | Payer: Medicare Other | Source: Ambulatory Visit

## 2019-07-07 ENCOUNTER — Ambulatory Visit: Payer: Medicare Other

## 2019-07-16 DEATH — deceased

## 2019-12-30 IMAGING — CR LUMBAR SPINE - 2-3 VIEW
1 series · 3 of 3 positions shown · non-contrast
Comparison: Plain film of the lumbar spine dated 10/06/2018.

CLINICAL DATA: Low back pain for 3 days.

EXAM:
LUMBAR SPINE - 2-3 VIEW

[Series 1: dg lumbar spine 2-3 views · 0.14mm/px · 3 of 3 slices shown]
[im 1/3]
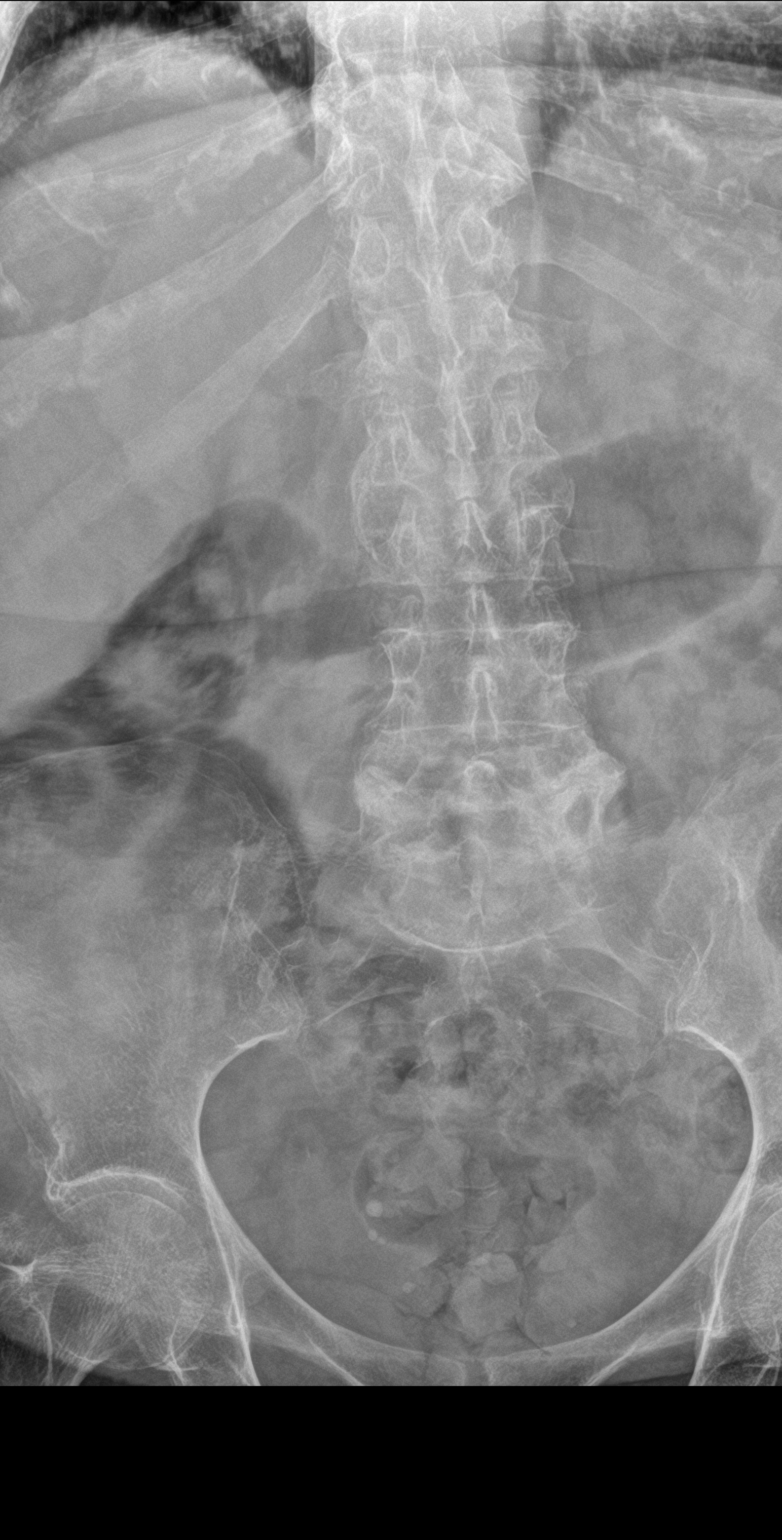
[im 2/3]
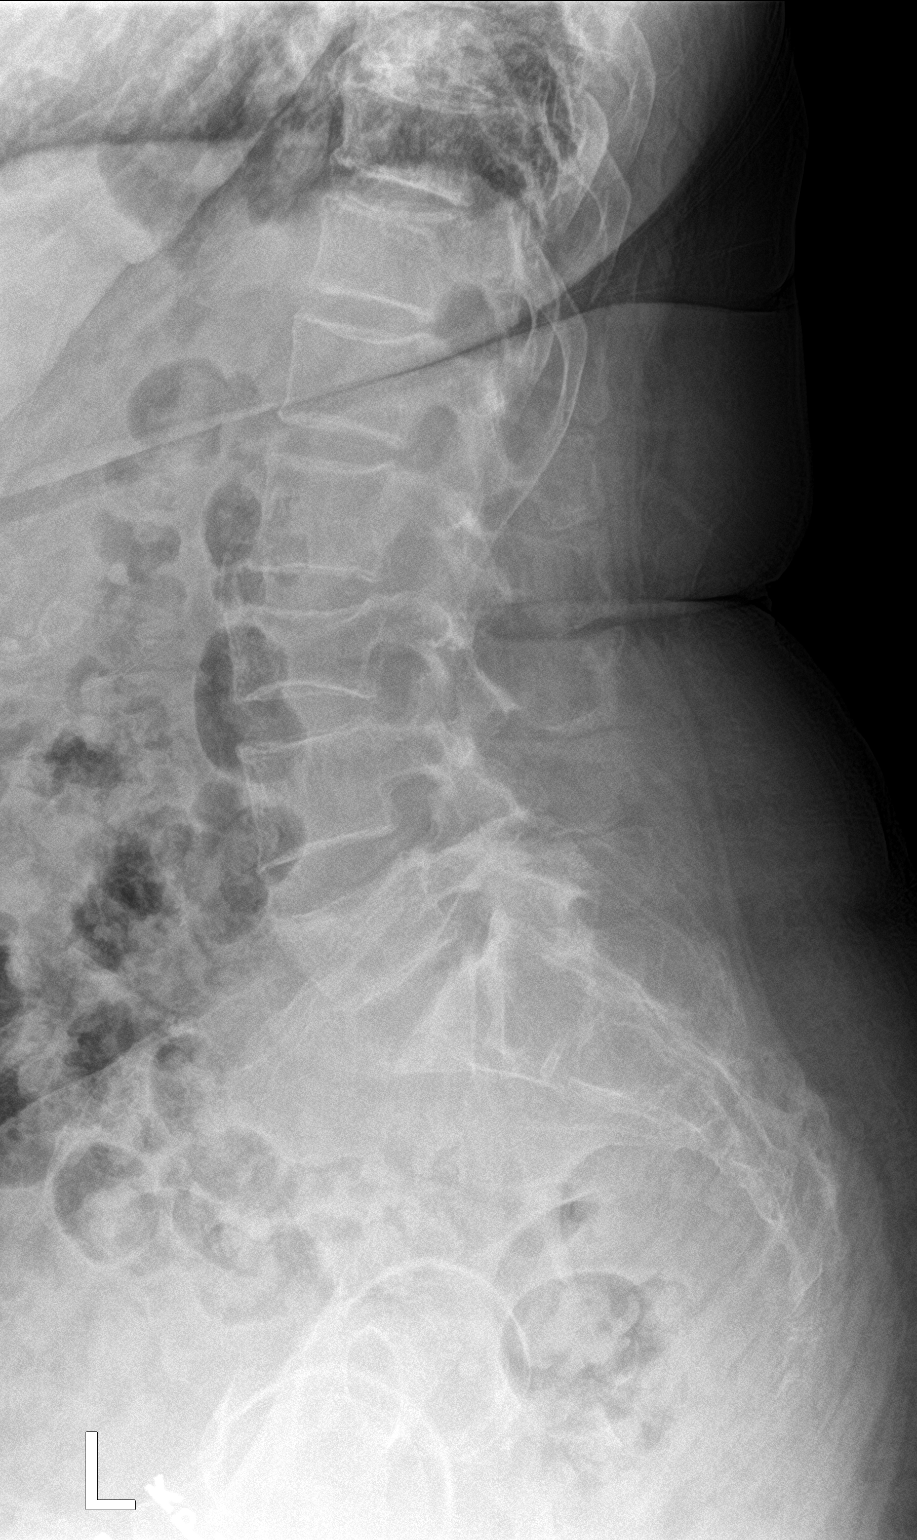
[im 3/3]
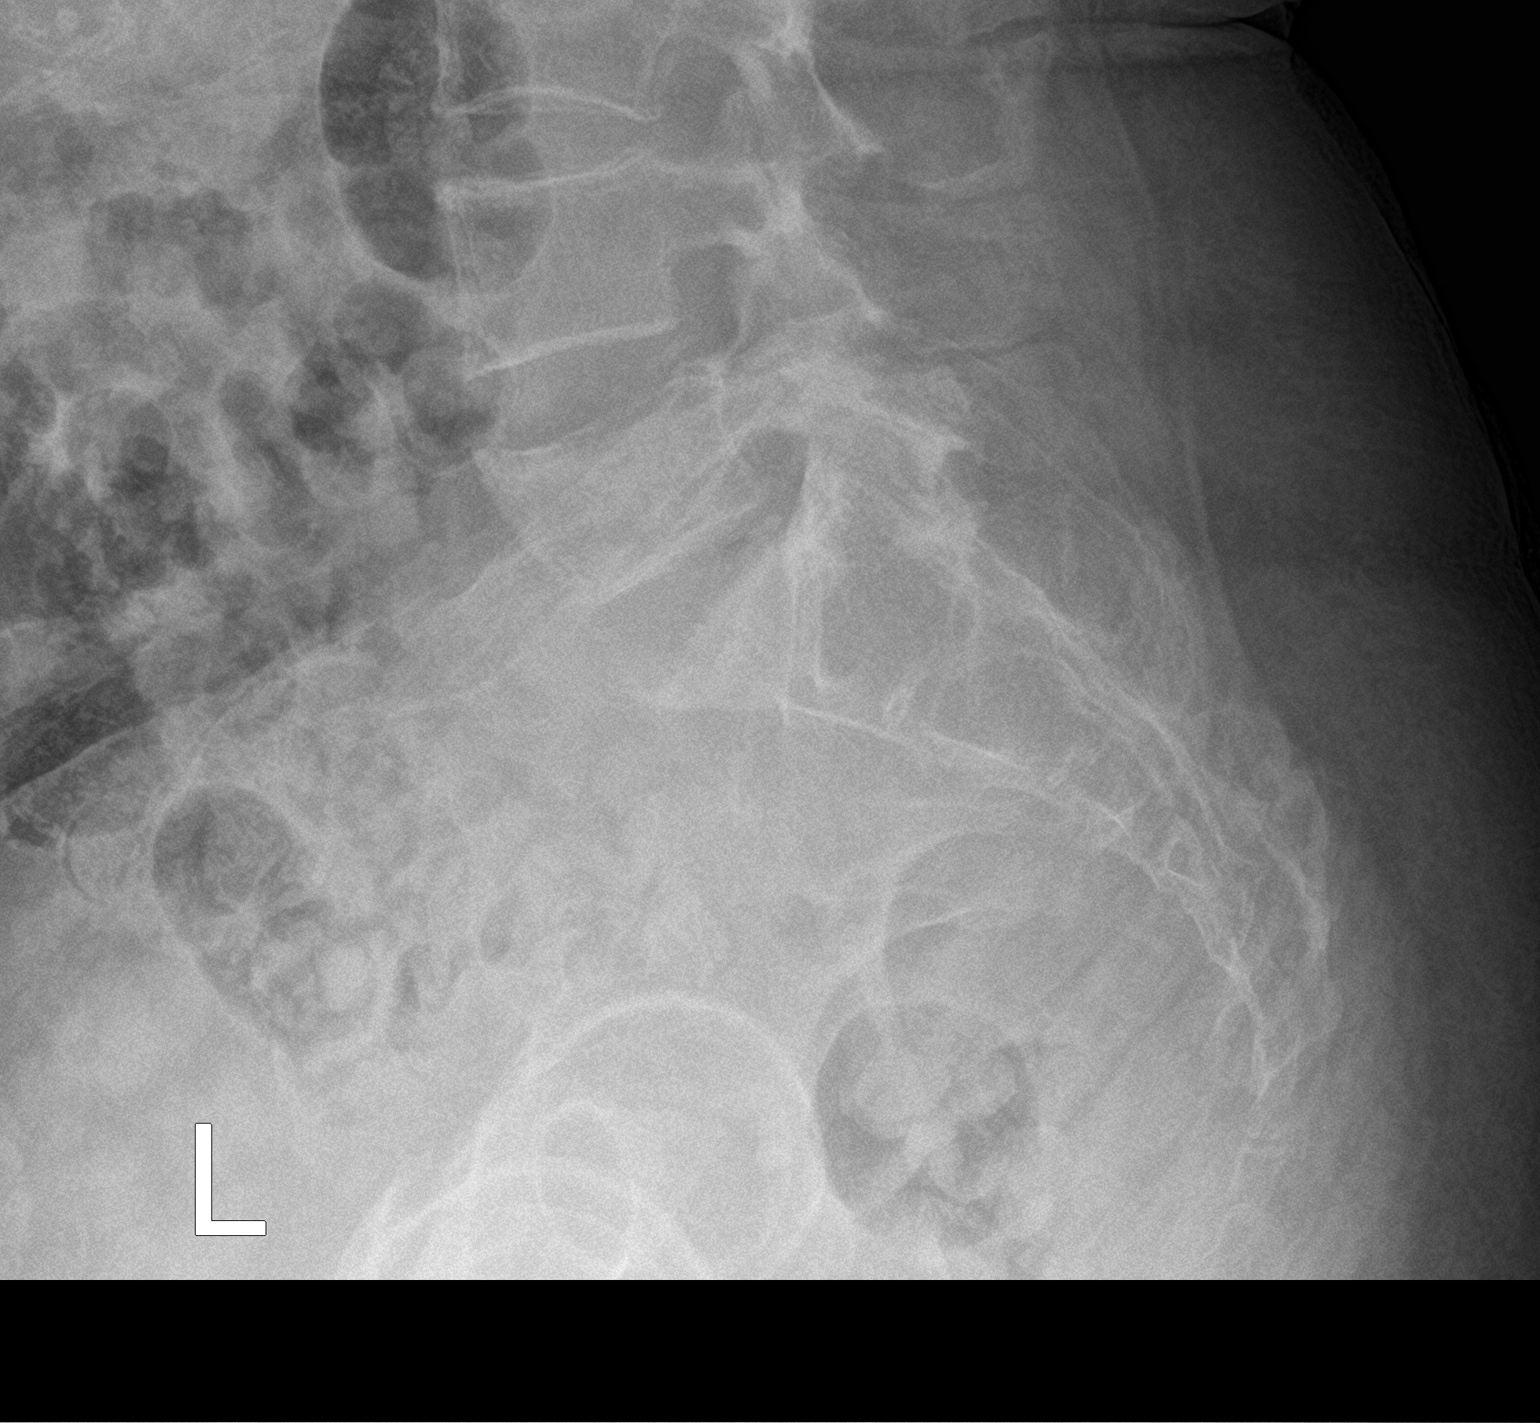

[3 of 3 positions shown; findings below may reference images not displayed]

FINDINGS: New compression fracture deformities involving the superior
endplates of the L3 and L5 vertebral bodies. L3 vertebral body is
compressed approximately 50%. L5 vertebral body is compressed
approximately 25%. No associated vertebral body subluxation.

Disc spaces appear well maintained throughout. Visualized
paravertebral soft tissues are unremarkable.
IMPRESSION: 1. New compression fracture deformities involving the superior
endplates of the L3 and L5 vertebral bodies, compressed 25-50% as
detailed above, of uncertain chronicity but new compared to the
previous plain film dated 10/06/2018, suspect acute to subacute.
2. Overall alignment of the lumbar spine is stable. No associated
vertebral body subluxation.
